# Patient Record
Sex: Female | Born: 1955 | Race: White | Hispanic: No | Marital: Married | Smoking: Never smoker
Health system: Southern US, Community
[De-identification: ages and names within clinical notes are randomized; demographics above are authoritative.]

## PROBLEM LIST (undated history)

## (undated) DIAGNOSIS — K219 Gastro-esophageal reflux disease without esophagitis: Secondary | ICD-10-CM

## (undated) DIAGNOSIS — D509 Iron deficiency anemia, unspecified: Secondary | ICD-10-CM

## (undated) DIAGNOSIS — R1310 Dysphagia, unspecified: Secondary | ICD-10-CM

## (undated) DIAGNOSIS — K222 Esophageal obstruction: Secondary | ICD-10-CM

## (undated) DIAGNOSIS — I10 Essential (primary) hypertension: Secondary | ICD-10-CM

## (undated) DIAGNOSIS — R05 Cough: Secondary | ICD-10-CM

## (undated) DIAGNOSIS — K449 Diaphragmatic hernia without obstruction or gangrene: Secondary | ICD-10-CM

## (undated) DIAGNOSIS — L509 Urticaria, unspecified: Secondary | ICD-10-CM

## (undated) DIAGNOSIS — R7303 Prediabetes: Secondary | ICD-10-CM

## (undated) DIAGNOSIS — Z91014 Allergy to mammalian meats: Secondary | ICD-10-CM

## (undated) HISTORY — DX: Essential (primary) hypertension: I10

## (undated) HISTORY — PX: COLONOSCOPY: SHX174

## (undated) HISTORY — DX: Urticaria, unspecified: L50.9

## (undated) HISTORY — PX: LAPAROSCOPIC VAGINAL HYSTERECTOMY WITH SALPINGO OOPHORECTOMY: SHX6681

## (undated) HISTORY — PX: ESOPHAGOGASTRODUODENOSCOPY (EGD) WITH ESOPHAGEAL DILATION: SHX5812

## (undated) HISTORY — PX: TUBAL LIGATION: SHX77

---

## 1985-05-17 HISTORY — PX: MANDIBLE SURGERY: SHX707

## 2002-05-03 ENCOUNTER — Encounter (INDEPENDENT_AMBULATORY_CARE_PROVIDER_SITE_OTHER): Payer: Self-pay | Admitting: Specialist

## 2002-05-03 ENCOUNTER — Inpatient Hospital Stay (HOSPITAL_COMMUNITY): Admission: RE | Admit: 2002-05-03 | Discharge: 2002-05-04 | Payer: Self-pay | Admitting: Obstetrics and Gynecology

## 2003-03-15 ENCOUNTER — Other Ambulatory Visit: Admission: RE | Admit: 2003-03-15 | Discharge: 2003-03-15 | Payer: Self-pay | Admitting: Obstetrics and Gynecology

## 2003-03-27 ENCOUNTER — Ambulatory Visit (HOSPITAL_COMMUNITY): Admission: RE | Admit: 2003-03-27 | Discharge: 2003-03-27 | Payer: Self-pay | Admitting: Obstetrics and Gynecology

## 2003-10-17 ENCOUNTER — Encounter (HOSPITAL_COMMUNITY): Admission: RE | Admit: 2003-10-17 | Discharge: 2003-10-18 | Payer: Self-pay | Admitting: Family Medicine

## 2003-11-22 ENCOUNTER — Ambulatory Visit (HOSPITAL_COMMUNITY): Admission: RE | Admit: 2003-11-22 | Discharge: 2003-11-22 | Payer: Self-pay | Admitting: Internal Medicine

## 2004-01-22 ENCOUNTER — Ambulatory Visit (HOSPITAL_COMMUNITY): Admission: RE | Admit: 2004-01-22 | Discharge: 2004-01-22 | Payer: Self-pay | Admitting: Family Medicine

## 2004-06-04 ENCOUNTER — Encounter: Admission: RE | Admit: 2004-06-04 | Discharge: 2004-06-04 | Payer: Self-pay | Admitting: Neurology

## 2004-06-04 ENCOUNTER — Encounter: Admission: RE | Admit: 2004-06-04 | Discharge: 2004-06-04 | Payer: Self-pay | Admitting: Family Medicine

## 2004-06-19 ENCOUNTER — Other Ambulatory Visit: Admission: RE | Admit: 2004-06-19 | Discharge: 2004-06-19 | Payer: Self-pay | Admitting: Obstetrics and Gynecology

## 2004-12-07 ENCOUNTER — Ambulatory Visit: Payer: Self-pay | Admitting: Internal Medicine

## 2005-07-20 ENCOUNTER — Other Ambulatory Visit: Admission: RE | Admit: 2005-07-20 | Discharge: 2005-07-20 | Payer: Self-pay | Admitting: Obstetrics and Gynecology

## 2005-11-09 ENCOUNTER — Ambulatory Visit: Payer: Self-pay | Admitting: Internal Medicine

## 2005-11-15 ENCOUNTER — Ambulatory Visit (HOSPITAL_COMMUNITY): Admission: RE | Admit: 2005-11-15 | Discharge: 2005-11-15 | Payer: Self-pay | Admitting: Internal Medicine

## 2005-12-10 ENCOUNTER — Ambulatory Visit (HOSPITAL_COMMUNITY): Admission: RE | Admit: 2005-12-10 | Discharge: 2005-12-10 | Payer: Self-pay | Admitting: Internal Medicine

## 2005-12-10 ENCOUNTER — Ambulatory Visit: Payer: Self-pay | Admitting: Internal Medicine

## 2006-09-01 ENCOUNTER — Encounter: Admission: RE | Admit: 2006-09-01 | Discharge: 2006-09-01 | Payer: Self-pay | Admitting: Obstetrics and Gynecology

## 2007-08-29 ENCOUNTER — Ambulatory Visit (HOSPITAL_COMMUNITY): Admission: RE | Admit: 2007-08-29 | Discharge: 2007-08-29 | Payer: Self-pay | Admitting: Family Medicine

## 2009-06-13 ENCOUNTER — Encounter: Admission: RE | Admit: 2009-06-13 | Discharge: 2009-06-13 | Payer: Self-pay | Admitting: Psychiatry

## 2010-10-02 NOTE — Consult Note (Signed)
NAME:  Kelly, Watkins NO.:  [id number]   MEDICAL RECORD NO.:  [id number]                   PATIENT TYPE:   LOCATION:                                       FACILITY:  [facility]   PHYSICIAN:  Lionel December, M.D.                 DATE OF BIRTH:  05-20-1955   DATE OF CONSULTATION:  10/25/2003  DATE OF DISCHARGE:                                   CONSULTATION   GASTROENTEROLOGY CONSULTATION:   REFERRING PHYSICIAN:  Dr. Phillips Odor   REASON FOR CONSULTATION:  Dysphagia.   Patient is also interested in screening colonoscopy.   HISTORY OF PRESENT ILLNESS:  Kelly Watkins is a 55 year old Caucasian female who was  referred through courtesy of Dr. Phillips Odor for evaluation of solid food  dysphagia which started 18 months ago following hysterectomy.  She recalls  after she went home for a week or so she had painful swallowing.  While pain  has disappeared, she has continued to experience dysphagia intermittently.  She feels as if food is getting stuck at the level of mid chest.  She gets  relief by waiting or sometimes she has to wash food bolus down.  She has not  had any episode of regurgitation.  She denies hoarseness or chronic cough.  She has heartburn but this is infrequent.  She has experienced pain in her  neck and throat which is shooting and once again not very frequent.  She  thought it was from her thyroid.  She had ultrasound of her neck.  She had  tiny nodules in both the right and the left lobe but no abnormality was  noted.  Also had thyroid scan which revealed enlarged glands bilaterally but  no evidence of hot or cold nodules.  She has a good appetite.  She denies  involuntary weight loss.  Her bowels move regularly and there is no history  of melena or rectal bleeding.  Patient is interested in having screening  colonoscopy.  She will be 48 in the next few months.   She is presently on Vivelle patch 0.1 mg to skin twice a week.   PAST MEDICAL HISTORY:  She was  diagnosed to have goiter about 15 years ago.  It has not caused any problems.  She had jaw reconstruction to correct her  bite in 1989.  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  About 5 years she had tubal ligation and in December 2003 she had BSO  with hysterectomy for pain and endometriosis.   ALLERGIES:  Allergies to UNKNOWN MEDICATION which caused a rash.   FAMILY HISTORY:  Mother has CAD; father and two sisters are in good health;  one maternal aunt died of breast carcinoma in her 26s.   SOCIAL HISTORY:  She is married; she has one child.  She is presently  working at [employer]; prior  to that she worked at [employer].  She has never smoked cigarettes and does not drink alcohol.   PHYSICAL EXAMINATION:  GENERAL:  Pleasant well-developed, well-nourished  Caucasian female who is in no acute distress.  VITAL SIGNS:  She weighs 173-1/2 pounds, she is 5 feet 4 inches tall, pulse  66 per minute, blood pressure 120/80, temperature is 97.5.  HEENT:  Conjunctivae are pink; sclerae are nonicteric.  Oropharyngeal mucosa  is normal.  Dentition in satisfactory condition.  Neck without adenopathy.  Thyroid is felt to be upper limit of normal.  No definite nodule noted on  palpation.  CARDIAC:  Regular rate, normal S1 and S3.  No murmur or gallop noted.  RESPIRATORY:  Lungs are clear to auscultation.  GI:  Abdomen is symmetrical and soft without organomegaly, masses or  tenderness.  RECTAL:  Exam is deferred.  EXTREMITIES:  No clubbing or edema noted.   ASSESSMENT:  Kelly Watkins is a 55 year old Caucasian female with intermittent solid  food dysphagia.  This symptom started 18 months ago following her  hysterectomy.  It is possible that she had esophageal injury to a pill  however, this may just be a coincidence.  She does not have symptoms of  gastroesophageal reflux disease therefore, she may have distal esophageal  ring.   Patient is also interested  in screening colonoscopy.  She is average risk  for this procedure.  I feel it would be more cost effective for her to have  a colonoscopy following an EGD so that she would not have a second trip.   RECOMMENDATIONS:  Diagnostic EGD and possible ED along with a screening  colonoscopy to be performed at [facility] within the next 4 weeks.   I have reviewed the procedure risks with the patient and she is agreeable.   I would like to thank Dr. Phillips Odor for the opportunity to participate in the  care of this nice lady.      ___________________________________________                                            Lionel December, M.D.   NR/MEDQ  D:  10/25/2003  T:  10/26/2003  Job:  [id number]   cc:   Corrie Mckusick, M.D.  [address] [place]. [place]  [place] [address]  Fax: [phone]   [facility]

## 2010-10-02 NOTE — Op Note (Signed)
NAME:  Kelly Watkins, Kelly Watkins                           ACCOUNT NO.:  [id number]   MEDICAL RECORD NO.:  [id number]                   PATIENT TYPE:  AMB   LOCATION:  DAY                                  FACILITY:  [facility]   PHYSICIAN:  Lionel December, M.D.                 DATE OF BIRTH:  1955-07-03   DATE OF PROCEDURE:  11/22/2003  DATE OF DISCHARGE:                                 OPERATIVE REPORT   PROCEDURE:  Esophagogastroduodenoscopy with esophageal dilatation followed  by total colonoscopy.   INDICATIONS:  Kelly Watkins is a 55 year old Caucasian female who has an 32-month  history of intermittent solid food dysphagia.  She does not have heartburn.  She will be 55 years old in a few months.  She, therefore, also is  interested in undergoing screening colonoscopy.  Both procedures were  reviewed with the patient and informed consent was obtained.   PREMEDICATION:  Cetacaine spray for pharyngeal topical anesthesia, Demerol  50 mg IV, Versed 17 mg IV.   FINDINGS:  The procedures were performed in the endoscopy suite.  The  patient's vital signs and O2 saturation were monitored during the procedure  and remained stable.   ESOPHAGOGASTRODUODENOSCOPY:  The patient was placed in the left lateral  recumbent position and Olympus videoscope was passed through the oropharynx  without any difficulty into the esophagus.   Esophagus:  Mucosa of the esophagus was normal.  There was incomplete  noncritical ring, distal esophagus.  There was circumferential Schatzki's  ring which was quite thick.  The scope, however, was easily passed across  it.  There was a 3 cm size sliding hiatal hernia.   Stomach:  It was empty and distended very well on insufflation.  Folds of  the proximal stomach were normal.  Multiple small polyps were noted in the  gastric body and fundus.  Most of these are a few millimeters.  The largest  one was 6-7 mm.  Five of these were biopsied for histology endoscopically.  These appeared to  be hypoplastic.  There was antral erythema and nodularity.  Pyloric channel was patent.  Angularis, fundus and cardia were examined by  retroflexing the scope.  Pyloric channel was normal.  Angularis and cardia  were normal.   Duodenum:  Examination of the bulb revealed patchy erythema and edema, but  no ulcer crater was noted.  The mucosa and folds of the second part of the  duodenum were normal.   The endoscope was withdrawn.   The esophagus was dilated by passing 54-French Maloney dilator to full  insertion.  A 56-French Maloney dilator could not be passed completely.  The  esophagus was reexamined and Schatzki's ring was noted to have been  disrupted.  Endoscope was withdrawn and the patient prepared for the  procedure.   TOTAL COLONOSCOPY:  Rectal examination performed.  No abnormality noted on  the external or digital exam.  Olympus videoscope was placed in the rectum  and advanced under vision to the sigmoid colon and beyond.  Preparation was  satisfactory.  Scope was passed to the cecum which was identified by the  ileocecal valve and appendiceal orifice.  Pictures were taken for the  record.  As the scope was withdrawn, colonic mucosa was once again carefully  examined.  No mucosal abnormalities were noted.  Rectal mucosa similarly was  normal.  Scope was retroflexed to examine anorectal junction and small  hemorrhoids were noted above the dentate line.  Endoscope was straightened  and withdrawn. The patient tolerated the procedure well.   FINAL DIAGNOSIS:  1. 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  2. Small sliding hiatal hernia.  3. 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  Biopsy     taken for histology.  4. Gastroduodenitis.  5. Normal colonoscopy except small internal hemorrhoids.   RECOMMENDATIONS:  1. Antireflux measures.  2. H pylori serology will be checked.  3. I will be contacting the  patient with results of blood tests and biopsy     and further recommendations.      ___________________________________________                                            Lionel December, M.D.   NR/MEDQ  D:  11/22/2003  T:  11/23/2003  Job:  [id number]   cc:   Corrie Mckusick, M.D.  [address] [place]. [place]  [place]  [place] [address]  Fax: [phone]

## 2010-10-02 NOTE — Op Note (Signed)
NAME:  Kelly Watkins, Kelly Watkins                           ACCOUNT NO.:  [id number]   MEDICAL RECORD NO.:  [id number]                   PATIENT TYPE:  INP   LOCATION:  9399                                 FACILITY:  [facility]   PHYSICIAN:  Guy Sandifer. Arleta Creek, M.D.           DATE OF BIRTH:  04-25-1956   DATE OF PROCEDURE:  05/03/2002  DATE OF DISCHARGE:                                 OPERATIVE REPORT   PREOPERATIVE DIAGNOSES:  1. Menorrhagia.  2. Pelvic pain.  3. Pelvic relaxation.   POSTOPERATIVE DIAGNOSES:  1. Menorrhagia.  2. Endometriosis.  3. Pelvic relaxation.  4. Pelvic adhesions.   PROCEDURE:  1. Laparoscopically assisted vaginal hysterectomy with bilateral salpingo-     oophorectomy.  2. Anterior vaginal repair.  3. Lysis of adhesions.  4. Ablation of endometriosis.   SURGEON:  Guy Sandifer. Henderson Cloud, M.D.   ASSISTANTStann Mainland. Vincente Poli, M.D.   ANESTHESIA:  General with endotracheal intubation.   ESTIMATED BLOOD LOSS:  200 cc.   SPECIMENS:  Uterus, tubes, and ovaries weighing 175 g.   INDICATIONS AND CONSENT:  The patient is a 55 year old married white female  G1, P1 status post tubal ligation who complains of the cervix protruding  from the vagina and pelvic pain and heavy vaginal bleeding.  Details are  dictated in the history and physical.  Laparoscopically assisted vaginal  hysterectomy with bilateral salpingo-oophorectomy, anterior/posterior  vaginal repair is discussed with the patient.  Potential risks and  complications have been discussed preoperatively including, but not limited  to, infection, bowel, bladder, ureteral damage, bleeding requiring  transfusion of blood products with possible transfusion reaction, HIV and  hepatitis acquisition, DVT, PE, pneumonia, fistula formation, postoperative  dyspareunia, and recurrent pelvic relaxation.  Issues of menopausal changes  and symptoms as well as indications and potential complications of estrogen  therapy and  alternative therapies have also been discussed.  All questions  have been answered and consent is signed and on the chart.   FINDINGS:  Upper abdomen is grossly normal.  There are multiple filmy  adhesions at the pelvic brim bilaterally.  Uterus is upper limits normal  size.  There is a 6 mm implant of endometriosis over the bladder dome.  The  ovaries are normal bilaterally.  Tubes are status post ligation.  Pelvic  side walls, posterior cul-de-sac is normal.   PROCEDURE:  The patient was taken to the operating room, placed in the  dorsal supine position where general anesthesia is induced via endotracheal  intubation.  She is then placed in the dorsal lithotomy position where she  is prepped abdominally and vaginally.  Bladder was straight catheterized and  Hulka tenaculum was placed in the uterus as a manipulator.  She is draped in  a sterile fashion.  A small infraumbilical incision was made and a 10-11  disposable trocar sleeve was placed without difficulty.  Placement was  verified with the  laparoscope and no damage to surrounding structures is  noted.  Pneumoperitoneum is induced.  A small suprapubic incision is made  and the 5 mm disposable trocar sleeve is placed under direct visualization  without difficulty.  The above findings are noted.  The course of the  ureters is noted bilaterally and seem to be well clear of the area of  surgery.  The implant of endometriosis on the bladder dome is cauterized  with bipolar cautery.  Then, using the gyrus cautery cutting instrument, the  infundibulopelvic ligaments followed by the broad ligament, round ligament  is taken down bilaterally down to the level of the vesicouterine fold  bilaterally.  Good hemostasis is maintained.  The vesicouterine fold is then  incised in the midline and dissected bilaterally.  The adhesions to the  pelvic brim bilaterally are taken down sharply.  Brief bipolar cautery at  the point of insertion on the  anterior abdominal wall is used to maintain  hemostasis.  This is well clear of the bowel on both sides.  Then attention  is turned to the vagina.  Posterior cul-de-sac is entered sharply.  Cervix  is circumscribed with the scalpel and the vaginal mucosa is advanced bluntly  and sharply.  The uterosacral ligaments are taken down bilaterally with the  gyrus cautery.  This is followed by the bladder pillars, cardinal ligaments,  uterine vessels bilaterally.  The fundus with tubes and ovaries is then  delivered posteriorly and remaining ligaments are taken down.  Specimen is  delivered.  The uterosacral ligaments are then plicated to the vaginal cuff  bilaterally with 0 Monocryl suture.  All suture will be 0 Monocryl unless  otherwise designated.  Uterosacral ligaments are then plicated in the  midline with a single suture.  Posterior vaginal cuff was closed with figure-  of-eight.  Then, careful inspection of the vagina reveals just a small  cystocele.  The vaginal introitus is fairly narrow and there is excellent  support of the posterior perineal body.  An anterior repair is then done,  dissecting the vaginal mucosa in the midline down to a point approximately 2  cm below the urethral meatus.  This dissection is then carried out  bilaterally sharply and bluntly.  The urethrovesical angle is then supported  with two interrupted sutures.  The vesicovaginal fascia is reapproximated in  midline with interrupted sutures.  A small amount of excess vaginal mucosa  is removed.  The anterior half of the vaginal cuff is closed with  interrupted 0 Monocryl suture.  The anterior vaginal mucosa is closed with  running locking 2-0 Monocryl suture.  Again, careful reinspection reveals  essentially no rectocele and again excellent support of the posterior  perineal body.  Therefore, a posterior repair is not done.  Bladder is then  drained with Foley catheter and clear urine is noted.  Bladder is  then filled retrograde using approximately 300 cc of normal saline.  A small stab  incision is made suprapubically and a Bonanno catheter is placed  suprapubically on the first attempt without difficulty.  There is good  aspiration of saline with and without the Stylette in place.  Bladder is  drained while the suprapubic catheter is stitched into place with nylon  suture.  Foley catheter is removed.  A 2 inch vaginal pack with Estrace  cream is then placed in the vagina.  Attention is then turned to the  abdomen.  Pneumoperitoneum is reintroduced and good hemostasis is obtained  with  bipolar cautery at the vaginal cuff.  This is inspected multiple times  under reduced pneumoperitoneum and seemed to be hemostatic.  Excess fluid is  removed.  Suprapubic trocar sleeve is removed.  Pneumoperitoneum is reduced  and again good hemostasis is noted.  The umbilical trocar sleeve is removed  as well.  A 0 Vicryl suture was used to reapproximate the subcutaneous  tissues in the umbilical incision with care being taken not to pick up any  underlying structures.  The skin incisions are then injected with 0.5% plain  Marcaine and the skin is closed with Dermabond on both incisions.  The  patient did receive preoperative antibiotics.  All counts correct.  The  patient was awakened and taken to recovery room in stable condition.                                               Guy Sandifer Arleta Creek, M.D.    JET/MEDQ  D:  05/03/2002  T:  05/03/2002  Job:  [id number]

## 2010-10-02 NOTE — Discharge Summary (Signed)
   NAME:  Kelly Watkins, Kelly Watkins                           ACCOUNT NO.:  [id number]   MEDICAL RECORD NO.:  [id number]                   PATIENT TYPE:  INP   LOCATION:  9313                                 FACILITY:  [facility]   PHYSICIAN:  Guy Sandifer. Arleta Creek, M.D.           DATE OF BIRTH:  08/25/1955   DATE OF ADMISSION:  05/03/2002  DATE OF DISCHARGE:  05/04/2002                                 DISCHARGE SUMMARY   ADMISSION DIAGNOSES:  1. Menorrhagia.  2. Pelvic pain.  3. Pelvic relaxation.   DISCHARGE DIAGNOSES:  1. Menorrhagia.  2. Endometriosis.  3. Pelvic relaxation.  4. Pelvic adhesions.   DESCRIPTION OF PROCEDURE:  On May 03, 2002, laparoscopically assisted  vaginal hysterectomy with bilateral salpingo-oophorectomy, lysis of  adhesions, ablation of endometriosis and anterior vaginal repair.   HOSPITAL COURSE:  The patient was admitted to the hospital to undergo the  above procedure without complications.  Estimated blood loss was 200 cc . On  the evening of surgery, she has good pain relief and is without nausea or  vomiting.  Urine output is clear, and she is afebrile.  On the morning of  the first postoperative day she is passing flatus, has good pain relief.  She remained afebrile.  White count is 8.3, hemoglobin 9.3, platelets  274,000.  On the evening of discharge she is viding well with low residual  urine's.  She remains afebrile and is ambulating, tolerating a regular diet.   DISCHARGE CONDITION:  Good.   </DIET >/  Diet good as tolerating.   ACTIVITY:  1. No lifting or operation of automobiles.  2. No vaginal entry.   PLAN:  1. She is to call the office for problems including but not limited to     temperature of 101.0 or greater, heavy vaginal bleeding, persistent     nausea, vomiting or increasing pain.  Follow up is in the office in two     weeks.   MEDICATIONS:  1. Percocet 5/325 mg #30, one to two p.o. q.6-8h. p.r.n.  2.     Ibuprofen 600 mg pother.  q.6h p.r.n.  3. Multivitamin daily.  4. Colace daily for two to three weeks p.r.n.                                               Guy Sandifer Arleta Creek, M.D.    JET/MEDQ  D:  05/04/2002  T:  05/06/2002  Job:  [id number]

## 2010-10-02 NOTE — Op Note (Signed)
NAME:  Kelly Watkins, Kelly Watkins                 ACCOUNT NO.:  [id number]   MEDICAL RECORD NO.:  [id number]          PATIENT TYPE:  AMB   LOCATION:  DAY                           FACILITY:  [facility]   PHYSICIAN:  Lionel December, M.D.    DATE OF BIRTH:  04-07-1956   DATE OF PROCEDURE:  12/10/2005  DATE OF DISCHARGE:                                 OPERATIVE REPORT   PROCEDURE:  Esophagogastroduodenoscopy with esophageal dilation.   INDICATIONS:  Kelly Watkins is a 55 year old Caucasian female with intermittent solid  food dysphagia.  She has history of distal esophageal ring, and her last  EGD/ED was in July 2005.  She had barium pill study which showed narrowing  to distal esophagus and was a moderate size sliding hiatal hernia.  Barium  pill did passed through this area without any difficulty.  She is undergoing  the therapeutic EGD.  Procedure and risks were reviewed with the patient,  and informed consent was obtained.  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   MEDICATIONS FOR CAUGHT CONSCIOUS SEDATION:  Benzocaine spray for pharyngeal  topical anesthesia, Demerol 50 mg IV, Versed 8 mg IV.   FINDINGS:  Procedure performed in endoscopy suite.  The patient's vital  signs and O2 sat were monitored during procedure and remained stable.  The  patient was placed in left lateral position, and Olympus videoscope was  passed via oropharynx without any difficulty into esophagus.   Esophagus.  Mucosa of the esophagus was normal.  There were 2 incomplete  rings at distal esophagus proximal to GE junction.  Mucosa, however, was  normal.  The GE junction was at 35 cm and hiatus was at 38 cm.   Stomach.  It was empty and distended very well with insufflation.  Folds of  proximal stomach were normal.  Examination mucosa revealed multiple gastric  polyps at body and fundus.  Most of these were a few millimeters.  The  largest ones were about 7-8 mm.  These were left  alone as there were  biopsied on her last exam 2 years ago.  Antral mucosa was normal.  Pyloric  channel was patent.  Angularis was normal.  Hernia was easily seen on  retroflexed view.   Duodenum.  Bulbar mucosa was normal.  Scope was passed into second part of  duodenum where mucosa and folds were normal.  Endoscope was withdrawn.   Esophagus was dilated by passing 56-French Maloney dilator to full  insertion.  As the dilator was withdrawn, endoscope was passed again, and  there was no mucosal disruption noted.  Therefore, balloon dilator was  passed to the scope and guide was pushed in the gastric lumen.  Initially,  it was insufflated to a diameter of 19 mm, but there was no mucosal  disruption and subsequently was inflated to a diameter of 20 mm and  maintained for about a minute.  I was able to gradually advance the balloon  into gastric lumen.  There were 2 linear tears which were rather small.  Proximal one  was prominent than the distal one.  Pictures taken for the  record.  Endoscope was withdrawn.  The patient tolerated the procedure well.   FINAL DIAGNOSES:  1.  Two rings at distal esophagus proximal to GE junction which could not be      disrupted by passing 56-French Maloney dilator, but 2 distal esophageal      rings proximal to the GE junction were disrupted with 19-20 mm balloon      but could not be disrupted by passing 56-French Maloney dilator.  2.  Small sliding hiatal hernia.  3.  Multiple small gastric polyps at body and fundus (hyperplastic).  4.  Normal examination of the first and second part of the duodenum.   __________ __________ __________   PROCEDURE:  Esophagogastroduodenoscopy with esophageal dilation.   INDICATIONS:  Kelly Watkins is a 55 year old Caucasian female with intermittent solid  food dysphagia.  She had her esophagus dilated 2 years ago.  She was found  to have a ring and a hernia.  At that time, she also had gastric polyps  which were biopsied and  returned to be hyperplastic.  She had barium pill  study recently which showed mild distal esophageal narrowing and a moderate  size sliding hiatal hernia.  A 13-mm barium pill passed through this area  without any difficulty.   She is undergoing therapeutic procedure.   Procedure and risks were reviewed with the patient, and informed consent was  obtained.   MEDICATIONS FOR CONSCIOUS SEDATION:  Benzocaine spray for pharyngeal topical  anesthesia, Demerol 50 mg IV, Versed 8 mg IV.   FINDINGS:  Procedure performed in endoscopy suite.  The patient's vital  signs and O2 saturation were monitored during the procedure and remained  stable.  The patient was placed in left lateral position.  Olympus  videoscope was passed via oropharynx without any difficulty into esophagus.   Esophagus.  Mucosa of the proximal and middle segment was normal.  Distally,  there were 2 rings, but they were incomplete.  Lumen was somewhat tortuous.  GE junction was unremarkable located at 35 cm from the incisors and hiatus  was at 38.   Stomach.  It distended very well with insufflation.  Folds of proximal  stomach were normal.  Examination of mucosa revealed multiple polyps at  gastric body and fundus.  Most of these are 4 to 5 mm, but the largest were  7-8 mm.  These were left alone as they have been biopsied before.  There  were a few telangiectasia at antrum which were left alone.  Pyloric channel  was patent.  Angularis was unremarkable, and hernia was easily seen on  retroflexed view.   Duodenum.  Bulbar mucosa was normal.  Scope was passed into the second part  of duodenum where mucosa and folds were normal.   Endoscope was withdrawn.  Esophagus was dilated by passing 56-French [facility]  dilator.  As the dilator was withdrawn, endoscope was passed again, and  rings were still intact.  Therefore, 18- to 20-mm balloon was used.  Balloon dilator was advanced through the scope.  Guidewire was pushed into  gastric  lumen.  Balloon catheter was positioned across the distal esophagus and  insufflated to a diameter of 19 mm, but the ring was still intact and was  subsequently disrupted with a diameter of 20 mm.  It was maintained for  about a minute.  Pictures taken for the record.  Balloon catheter was  withdrawn.  Stomach was deflated,  and the endoscope was withdrawn.  The  patient tolerated the procedure well.   FINAL DIAGNOSES:  1.  Two distal esophageal rings which were disrupted with 19- to 20-mm      balloon.  The passage of 56-French [facility] dilator did not induce      mucosal disruption.  2.  Small sliding hiatal hernia.  3.  Multiple hyperplastic gastric polyps which were less than 8 mm in      diameter.  4.  Few tiny antral vascular ectasia.   RECOMMENDATIONS:  1.  She will continue anti-reflux measures and PPI as before.  2.  The patient will call office with a progress report in one week from      now.      Lionel December, M.D.  Electronically Signed     NR/MEDQ  D:  12/10/2005  T:  12/10/2005  Job:  [id number]   cc:   Corrie Mckusick, M.D.  Fax: [phone]

## 2010-10-02 NOTE — H&P (Signed)
NAME:  Kelly Watkins, Kelly Watkins                             ACCOUNT NO.:  [id number]   MEDICAL RECORD NO.:  [id number]                   PATIENT TYPE:   LOCATION:                                       FACILITY:  [facility]   PHYSICIAN:  Guy Sandifer. Arleta Creek, M.D.           DATE OF BIRTH:   DATE OF ADMISSION:  05/03/2002  DATE OF DISCHARGE:                                HISTORY & PHYSICAL   CHIEF COMPLAINT:  Heavy painful menses and cervix is falling out.   HISTORY OF PRESENT ILLNESS:  This patient is a 55 year old married white  female, Gravida I, Para I, status post tubal ligation, who is having fairly  regular menses. However, they are becoming progressively heavier. She has at  least one day of changing the pad every two hours with heavy clotting. She  has pain that essentially lasts through the month over her lower back.  However, before her menses, the pain in the lower back as well as the  anterior abdominal wall becomes quite bad. She has bumper dyspareunia and  nocturia about four times a night. An ultrasound on March 02, 2002  describes a uterus measuring 8.9 x 4.6 x 5.7 mm with normal appearing  ovaries. After careful discussion of the options, the patient is being  admitted for laparoscopically assisted vaginal hysterectomy with bilateral  salpingo-oophorectomy and anterior/posterior vaginal repair.   PAST MEDICAL HISTORY:  1. Occasional diarrhea.  2. Superficial varicose veins in the lower extremities.   PAST SURGICAL HISTORY:  1. Tubal ligation.  2. Eye surgery eight years ago.  3. Jaw surgery fifteen years ago.   MEDICATIONS:  None.   ALLERGIES:  HCTZ (itching), PENICILLIN (flu-like symptoms).   FAMILY HISTORY:  Multiple gestations in grandmother. Diabetes mellitus in  aunt. Leukemia and breast cancer in an aunt and skin cancer in an uncle.  Heart disease in mother.   OBSTETRIC HISTORY:  Vaginal delivery times one.   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 She  denies tobacco, alcohol, or drug abuse.   REVIEW OF SYSTEMS:  Negative except as above.   PHYSICAL EXAMINATION:  VITAL SIGNS: Height 5'4. Weight 156 pounds. Blood  pressure 120/74.  HEENT: Without thyromegaly.  LUNGS:  Clear to auscultation and percussion.  HEART: Regular rate and rhythm.  BACK: No cardiovascular tenderness.  BREAST: Without masses.  ABDOMEN: Soft and nontender without masses.  PELVIC: Vaginal and cervix without lesion. Uterus normal size and mobile  prolapsing down close to the vaginal introitus. There is a first degree  cystocele and first degree rectocele. Adnexa non-tender without masses.  RECTAL: Examination without masses.  EXTREMITIES: Within normal limits.  NEURO: Grossly within normal limits.   ASSESSMENT:  1. Menorrhagia.  2. Pelvic pain.  3. Pelvic relaxation.   PLAN:  Laparoscopically assisted vaginal hysterectomy with bilateral  salpingo-oophorectomy and anterior posterior vaginal repairs.  Guy Sandifer Arleta Creek, M.D.    JET/MEDQ  D:  04/30/2002  T:  04/30/2002  Job:  [id number]

## 2011-03-01 ENCOUNTER — Other Ambulatory Visit (HOSPITAL_COMMUNITY): Payer: Self-pay | Admitting: Urology

## 2011-03-01 DIAGNOSIS — R350 Frequency of micturition: Secondary | ICD-10-CM

## 2011-03-02 ENCOUNTER — Ambulatory Visit (HOSPITAL_COMMUNITY)
Admission: RE | Admit: 2011-03-02 | Discharge: 2011-03-02 | Disposition: A | Discharge status: Home / Self Care | Payer: BC Managed Care – PPO | Source: Ambulatory Visit | Attending: Urology | Admitting: Urology

## 2011-03-02 DIAGNOSIS — R350 Frequency of micturition: Secondary | ICD-10-CM | POA: Insufficient documentation

## 2011-03-03 ENCOUNTER — Other Ambulatory Visit (HOSPITAL_COMMUNITY): Payer: Self-pay

## 2011-03-04 ENCOUNTER — Ambulatory Visit (HOSPITAL_COMMUNITY): Payer: BC Managed Care – PPO

## 2011-09-03 ENCOUNTER — Other Ambulatory Visit: Payer: Self-pay | Admitting: Obstetrics and Gynecology

## 2011-11-10 ENCOUNTER — Encounter (HOSPITAL_COMMUNITY): Payer: Self-pay | Admitting: Emergency Medicine

## 2011-11-10 ENCOUNTER — Emergency Department (HOSPITAL_COMMUNITY)
Admission: EM | Admit: 2011-11-10 | Discharge: 2011-11-10 | Disposition: A | Discharge status: Home / Self Care | Payer: BC Managed Care – PPO | Attending: Emergency Medicine | Admitting: Emergency Medicine

## 2011-11-10 DIAGNOSIS — I10 Essential (primary) hypertension: Secondary | ICD-10-CM | POA: Insufficient documentation

## 2011-11-10 DIAGNOSIS — K219 Gastro-esophageal reflux disease without esophagitis: Secondary | ICD-10-CM | POA: Insufficient documentation

## 2011-11-10 DIAGNOSIS — R42 Dizziness and giddiness: Secondary | ICD-10-CM | POA: Insufficient documentation

## 2011-11-10 HISTORY — DX: Gastro-esophageal reflux disease without esophagitis: K21.9

## 2011-11-10 LAB — URINALYSIS, ROUTINE W REFLEX MICROSCOPIC
Bilirubin Urine: NEGATIVE
Glucose, UA: NEGATIVE mg/dL
Hgb urine dipstick: NEGATIVE
Ketones, ur: NEGATIVE mg/dL
Leukocytes, UA: NEGATIVE
Nitrite: NEGATIVE
Protein, ur: NEGATIVE mg/dL
Specific Gravity, Urine: <1.005 — ABNORMAL LOW (ref 1.005–1.030)
Urobilinogen, UA: 0.2 mg/dL (ref 0.0–1.0)
pH: 6 (ref 5.0–8.0)

## 2011-11-10 LAB — COMPREHENSIVE METABOLIC PANEL
ALT: 12 U/L (ref 0–35)
AST: 16 U/L (ref 0–37)
Albumin: 3.8 g/dL (ref 3.5–5.2)
Alkaline Phosphatase: 79 U/L (ref 39–117)
BUN: 17 mg/dL (ref 6–23)
CO2: 27 mEq/L (ref 19–32)
Calcium: 9.4 mg/dL (ref 8.4–10.5)
Chloride: 103 mEq/L (ref 96–112)
Creatinine, Ser: 0.8 mg/dL (ref 0.50–1.10)
GFR calc Af Amer: >90 mL/min (ref >90)
GFR calc non Af Amer: 80 mL/min — ABNORMAL LOW (ref >90)
Glucose, Bld: 105 mg/dL — ABNORMAL HIGH (ref 70–99)
Potassium: 3.8 mEq/L (ref 3.5–5.1)
Sodium: 141 mEq/L (ref 135–145)
Total Bilirubin: 0.3 mg/dL (ref 0.3–1.2)
Total Protein: 7.4 g/dL (ref 6.0–8.3)

## 2011-11-10 LAB — CBC
HCT: 40.2 % (ref 36.0–46.0)
Hemoglobin: 13.3 g/dL (ref 12.0–15.0)
MCH: 27.1 pg (ref 26.0–34.0)
MCHC: 33.1 g/dL (ref 30.0–36.0)
MCV: 82 fL (ref 78.0–100.0)
Platelets: 347 10*3/uL (ref 150–400)
RBC: 4.9 MIL/uL (ref 3.87–5.11)
RDW: 12.5 % (ref 11.5–15.5)
WBC: 7.5 10*3/uL (ref 4.0–10.5)

## 2011-11-10 LAB — TROPONIN I: Troponin I: <0.3 ng/mL (ref <0.30)

## 2011-11-10 MED ORDER — METOPROLOL TARTRATE 50 MG PO TABS: 50.0000 mg | ORAL_TABLET | Freq: Two times a day (BID) | ORAL | Status: DC

## 2011-11-10 NOTE — ED Provider Notes (Signed)
I have personally seen and examined the patient.  I have discussed the plan of care with the resident.  I have reviewed the documentation on PMH/FH/Soc. History.  I have reviewed the documentation of the resident and agree.  I have reviewed and agree with the ECG interpretation(s) documented by the resident.  Pt well appearing, denies focal weakness, denies significant headache, denies CP/SOB Needs outpatient management of HTN   Joya Gaskins, MD 11/10/11 1446

## 2011-11-10 NOTE — ED Notes (Signed)
Pt at work. Began to have dizziness with headache and some jaw pain. Pt took BP and was elevated 190/108. Called EMS for evaluation. Pt sent here for continued follow-up. Denies chest pain or SOB.

## 2011-11-10 NOTE — Discharge Instructions (Signed)
Your high blood pressure is most likely chronic and worsened by stress. You should start low dose medication. Make an appointment with Dr. Phillips Odor in next 1-2 weeks for follow up. If you have any chest pain, shortness of breath or other concerning symptoms then return to the ED.  Hypertension As your heart beats, it forces blood through your arteries. This force is your blood pressure. If the pressure is too high, it is called hypertension (HTN) or high blood pressure. HTN is dangerous because you may have it and not know it. High blood pressure may mean that your heart has to work harder to pump blood. Your arteries may be narrow or stiff. The extra work puts you at risk for heart disease, stroke, and other problems.  Blood pressure consists of two numbers, a higher number over a lower, 110/72, for example. It is stated as "110 over 72." The ideal is below 120 for the top number (systolic) and under 80 for the bottom (diastolic). Write down your blood pressure today. You should pay close attention to your blood pressure if you have certain conditions such as:  Heart failure.   Prior heart attack.   Diabetes   Chronic kidney disease.   Prior stroke.   Multiple risk factors for heart disease.  To see if you have HTN, your blood pressure should be measured while you are seated with your arm held at the level of the heart. It should be measured at least twice. A one-time elevated blood pressure reading (especially in the Emergency Department) does not mean that you need treatment. There may be conditions in which the blood pressure is different between your right and left arms. It is important to see your caregiver soon for a recheck. Most people have essential hypertension which means that there is not a specific cause. This type of high blood pressure may be lowered by changing lifestyle factors such as:  Stress.   Smoking.   Lack of exercise.   Excessive weight.   Drug/tobacco/alcohol  use.   Eating less salt.  Most people do not have symptoms from high blood pressure until it has caused damage to the body. Effective treatment can often prevent, delay or reduce that damage. TREATMENT  When a cause has been identified, treatment for high blood pressure is directed at the cause. There are a large number of medications to treat HTN. These fall into several categories, and your caregiver will help you select the medicines that are best for you. Medications may have side effects. You should review side effects with your caregiver. If your blood pressure stays high after you have made lifestyle changes or started on medicines,   Your medication(s) may need to be changed.   Other problems may need to be addressed.   Be certain you understand your prescriptions, and know how and when to take your medicine.   Be sure to follow up with your caregiver within the time frame advised (usually within two weeks) to have your blood pressure rechecked and to review your medications.   If you are taking more than one medicine to lower your blood pressure, make sure you know how and at what times they should be taken. Taking two medicines at the same time can result in blood pressure that is too low.  SEEK IMMEDIATE MEDICAL CARE IF:  You develop a severe headache, blurred or changing vision, or confusion.   You have unusual weakness or numbness, or a faint feeling.   You  have severe chest or abdominal pain, vomiting, or breathing problems.  MAKE SURE YOU:   Understand these instructions.   Will watch your condition.   Will get help right away if you are not doing well or get worse.  Document Released: 05/03/2005 Document Revised: 04/22/2011 Document Reviewed: 12/22/2007 [facility] Patient Information 2012 [place], [place].

## 2011-11-10 NOTE — ED Provider Notes (Signed)
History     CSN: [id number]  Arrival date & time 11/10/11  1036   First MD Initiated Contact with Patient 11/10/11 1053      Chief Complaint  Patient presents with  . Hypertension  . Dizziness    (Consider location/radiation/quality/duration/timing/severity/associated sxs/prior treatment) HPI Comments: 56 yo female with history of GERD and goiter present via EMS from work due to an episode of lightheadedness, after which her coworker checked BP and was 190/108. The symptoms lasted several minutes. She also felt some left sided head/jaw pain that lasted several seconds. Currently is symptom free. She had a similar episode about one week ago after which BP was monitored at work (at [employer]) and returned to near normal. States she has been under a lot of stress working at [employer], starting new EMR, and worry about her husband's heart condition.   She denies n/v/d, fever, chest pain, palpitations, dyspnea, visual changes, diaphoresis, leg edema, vertigo, new medications, anxiety, alcohol use, tobacco/drug use, sleep apnea symptoms.  Patient is a 56 y.o. female presenting with hypertension.  Hypertension Pertinent negatives include no abdominal pain, chest pain, chills, fever, nausea, neck pain or rash.    Past Medical History  Diagnosis Date  . GERD (gastroesophageal reflux disease)     Past Surgical History  Procedure Date  . Abdominal hysterectomy   . Tubal ligation   . Eye surgery     Family History  Problem Relation Age of Onset  . Hypertension Mother     History  Substance Use Topics  . Smoking status: Never Smoker   . Smokeless tobacco: Not on file  . Alcohol Use: No    OB History    Grav Para Term Preterm Abortions TAB SAB Ect Mult Living                  Review of Systems  Constitutional: Negative for fever, chills and unexpected weight change.  HENT: Negative for neck pain.   Eyes: Negative for pain.  Cardiovascular: Negative for chest  pain, palpitations and leg swelling.  Gastrointestinal: Negative for nausea, abdominal pain and diarrhea.  Genitourinary: Negative for dysuria and difficulty urinating.  Skin: Negative for rash.  Neurological: Positive for light-headedness. Negative for syncope and speech difficulty.  Psychiatric/Behavioral: Negative for agitation.  All other systems reviewed and are negative.    Allergies  Hydrochlorothiazide  Home Medications   Current Outpatient Rx  Name Route Sig Dispense Refill  . GREEN COFFEE BEAN PO Oral Take 1 tablet by mouth 2 (two) times daily.    Marland Kitchen OMEPRAZOLE 20 MG PO CPDR Oral Take 20 mg by mouth daily.      BP 165/90  Pulse 75  Temp 98.1 F (36.7 C) (Oral)  Resp 18  Ht 5\' 4"  (1.626 m)  Wt 175 lb (79.379 kg)  BMI 30.04 kg/m2  SpO2 100%  Physical Exam  Vitals reviewed. Constitutional: She is oriented to person, place, and time. She appears well-developed and well-nourished. No distress.  HENT:  Head: Normocephalic and atraumatic.  Mouth/Throat: Oropharynx is clear and moist.  Eyes: EOM are normal. Pupils are equal, round, and reactive to light.  Neck: Neck supple. No thyromegaly present.  Cardiovascular: Normal rate, regular rhythm and normal heart sounds.   No murmur heard. Pulmonary/Chest: Effort normal and breath sounds normal. No respiratory distress. She has no wheezes. She has no rales.  Abdominal: Soft. Bowel sounds are normal. She exhibits no distension. There is no tenderness.  Musculoskeletal: She exhibits no edema and no tenderness.  Neurological: She is alert and oriented to person, place, and time. No cranial nerve deficit. She exhibits normal muscle tone. Coordination normal.  Skin: No rash noted. She is not diaphoretic.  Psychiatric: She has a normal mood and affect.    ED Course  Procedures (including critical care time)  Date: 11/10/2011  Rate: 72  Rhythm: normal sinus rhythm  QRS Axis: normal  Intervals: normal  ST/T Wave  abnormalities: normal  Conduction Disutrbances:none  Narrative Interpretation:   Old EKG Reviewed: none available     Labs Reviewed  CBC  COMPREHENSIVE METABOLIC PANEL  TSH  URINALYSIS, ROUTINE W REFLEX MICROSCOPIC  TROPONIN I   Results for orders placed during the hospital encounter of 11/10/11 (from the past 24 hour(s))  CBC     Status: Normal   Collection Time   11/10/11 11:13 AM      Component Value Range   WBC 7.5  4.0 - 10.5 K/uL   RBC 4.90  3.87 - 5.11 MIL/uL   Hemoglobin 13.3  12.0 - 15.0 g/dL   HCT 11.9  14.7 - 82.9 %   MCV 82.0  78.0 - 100.0 fL   MCH 27.1  26.0 - 34.0 pg   MCHC 33.1  30.0 - 36.0 g/dL   RDW 56.2  13.0 - 86.5 %   Platelets 347  150 - 400 K/uL  COMPREHENSIVE METABOLIC PANEL     Status: Abnormal   Collection Time   11/10/11 11:13 AM      Component Value Range   Sodium 141  135 - 145 mEq/L   Potassium 3.8  3.5 - 5.1 mEq/L   Chloride 103  96 - 112 mEq/L   CO2 27  19 - 32 mEq/L   Glucose, Bld 105 (*) 70 - 99 mg/dL   BUN 17  6 - 23 mg/dL   Creatinine, Ser 7.84  0.50 - 1.10 mg/dL   Calcium 9.4  8.4 - 69.6 mg/dL   Total Protein 7.4  6.0 - 8.3 g/dL   Albumin 3.8  3.5 - 5.2 g/dL   AST 16  0 - 37 U/L   ALT 12  0 - 35 U/L   Alkaline Phosphatase 79  39 - 117 U/L   Total Bilirubin 0.3  0.3 - 1.2 mg/dL   GFR calc non Af Amer 80 (*) >90 mL/min   GFR calc Af Amer >90  >90 mL/min  TROPONIN I     Status: Normal   Collection Time   11/10/11 11:13 AM      Component Value Range   Troponin I <0.30  <0.30 ng/mL     No results found.   1. Uncontrolled hypertension      MDM  56 yo female with history GERD and goiter presents with recurrent episodic hypertension with lightheadedness. Patient is asymptomatic and BP has resolved without medication to 150s/85. EKG negative for any ischemic changes, Will check basic labs, trop, TSH. Noted HCTZ in allergy list, so suspect this HTN is not altogether new. Will start low dose beta blocker and advise PCP follow up  for titration. Discussed with patient red flags including severe headache, neurologic signs, visual changes, syncope, chest pain to seek emergency care.        Durwin Reges, MD 11/10/11 [phone]

## 2011-11-11 LAB — TSH: TSH: 2.493 u[IU]/mL (ref 0.350–4.500)

## 2012-08-21 ENCOUNTER — Other Ambulatory Visit (INDEPENDENT_AMBULATORY_CARE_PROVIDER_SITE_OTHER): Payer: Self-pay | Admitting: Internal Medicine

## 2012-09-26 ENCOUNTER — Other Ambulatory Visit (INDEPENDENT_AMBULATORY_CARE_PROVIDER_SITE_OTHER): Payer: Self-pay | Admitting: Internal Medicine

## 2012-11-24 ENCOUNTER — Other Ambulatory Visit: Payer: Self-pay | Admitting: Obstetrics and Gynecology

## 2013-03-12 ENCOUNTER — Other Ambulatory Visit (HOSPITAL_COMMUNITY): Payer: Self-pay | Admitting: Family Medicine

## 2013-03-12 ENCOUNTER — Ambulatory Visit (HOSPITAL_COMMUNITY)
Admission: RE | Admit: 2013-03-12 | Discharge: 2013-03-12 | Disposition: A | Discharge status: Home / Self Care | Payer: BC Managed Care – PPO | Source: Ambulatory Visit | Attending: Family Medicine | Admitting: Family Medicine

## 2013-03-12 DIAGNOSIS — R05 Cough: Secondary | ICD-10-CM | POA: Insufficient documentation

## 2013-03-12 DIAGNOSIS — R079 Chest pain, unspecified: Secondary | ICD-10-CM | POA: Insufficient documentation

## 2013-03-12 DIAGNOSIS — R059 Cough, unspecified: Secondary | ICD-10-CM

## 2013-03-12 DIAGNOSIS — R509 Fever, unspecified: Secondary | ICD-10-CM | POA: Insufficient documentation

## 2013-03-12 DIAGNOSIS — R0602 Shortness of breath: Secondary | ICD-10-CM | POA: Insufficient documentation

## 2013-05-02 ENCOUNTER — Other Ambulatory Visit (INDEPENDENT_AMBULATORY_CARE_PROVIDER_SITE_OTHER): Payer: Self-pay | Admitting: Internal Medicine

## 2013-05-17 HISTORY — PX: CATARACT EXTRACTION W/ INTRAOCULAR LENS  IMPLANT, BILATERAL: SHX1307

## 2013-11-30 ENCOUNTER — Other Ambulatory Visit: Payer: Self-pay | Admitting: Obstetrics and Gynecology

## 2013-12-04 LAB — CYTOLOGY - PAP

## 2014-01-15 ENCOUNTER — Other Ambulatory Visit (INDEPENDENT_AMBULATORY_CARE_PROVIDER_SITE_OTHER): Payer: Self-pay | Admitting: Internal Medicine

## 2014-01-15 NOTE — Telephone Encounter (Signed)
Patient will need an office appointment prior to further refills. 

## 2014-03-31 ENCOUNTER — Other Ambulatory Visit (INDEPENDENT_AMBULATORY_CARE_PROVIDER_SITE_OTHER): Payer: Self-pay | Admitting: Internal Medicine

## 2014-04-01 NOTE — Telephone Encounter (Signed)
Per Dr.Rehman the patient will need to have a office visit with Terri prior to  any further . This prescription was done with 1 refill.

## 2014-04-18 NOTE — Telephone Encounter (Signed)
LM on cell voice mail.

## 2014-05-07 NOTE — Telephone Encounter (Signed)
Apt has been scheduled for 05/29/14 with Terri Setzer, NP.  

## 2014-05-29 ENCOUNTER — Telehealth (INDEPENDENT_AMBULATORY_CARE_PROVIDER_SITE_OTHER): Payer: Self-pay | Admitting: *Deleted

## 2014-05-29 ENCOUNTER — Ambulatory Visit (INDEPENDENT_AMBULATORY_CARE_PROVIDER_SITE_OTHER): Payer: BLUE CROSS/BLUE SHIELD | Admitting: Internal Medicine

## 2014-05-29 ENCOUNTER — Encounter (INDEPENDENT_AMBULATORY_CARE_PROVIDER_SITE_OTHER): Payer: Self-pay | Admitting: Internal Medicine

## 2014-05-29 ENCOUNTER — Other Ambulatory Visit (INDEPENDENT_AMBULATORY_CARE_PROVIDER_SITE_OTHER): Payer: Self-pay | Admitting: *Deleted

## 2014-05-29 DIAGNOSIS — R1310 Dysphagia, unspecified: Secondary | ICD-10-CM

## 2014-05-29 DIAGNOSIS — Z1211 Encounter for screening for malignant neoplasm of colon: Secondary | ICD-10-CM

## 2014-05-29 DIAGNOSIS — K219 Gastro-esophageal reflux disease without esophagitis: Secondary | ICD-10-CM

## 2014-05-29 DIAGNOSIS — I10 Essential (primary) hypertension: Secondary | ICD-10-CM | POA: Insufficient documentation

## 2014-05-29 MED ORDER — OMEPRAZOLE 20 MG PO CPDR: 20.0000 mg | DELAYED_RELEASE_CAPSULE | Freq: Every day | ORAL | Status: DC

## 2014-05-29 NOTE — Progress Notes (Addendum)
Subjective:    Patient ID: Kelly Watkins, female    DOB: June 10, 1955, 59 y.o.   MRN: [id number]  HPI Here today for f/u. She tells me she occasionally has dysphagia. She says for the last couple of months it is better. Foods sometimes do lodge. Acid reflux controlled with Omeprazole. Patient is interested in an EGD/ED.  She takes Omeprazole on a prn basis.  Appetite is good. No weight loss.  Usually has a BM once a daily. No melena or BRRB. Her last colonoscopy was 10 yrs ago and normal except for small internal hemorrhoids.    11/22/2003 EGD/Colonoscopy:  PROCEDURE: Esophagogastroduodenoscopy with esophageal dilatation followed by total colonoscopy.  INDICATIONS: [name] is a 59 year old Caucasian female who has an 38-month history of intermittent solid food dysphagia. She does not have heartburn. She will be 59 years old in a few months. She, therefore, also is interested in undergoing screening colonoscopy. Both procedures were reviewed with the patient and informed consent was obtained.  FINAL DIAGNOSIS: 1. 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 2. Small sliding hiatal hernia. 3. 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 Biopsy  taken for histology. 4. Gastroduodenitis. 5. Normal colonoscopy except small internal hemorrhoids.  12/10/2005 EGD/ED: INDICATIONS: [name] is a 59 year old Caucasian female with intermittent solid food dysphagia. She has history of distal esophageal ring, and her last EGD/ED was in July 2005. She had barium pill study which showed narrowing to distal esophagus and was a moderate size sliding hiatal hernia. Barium pill did passed through this area without any FINAL DIAGNOSES: 1. Two distal esophageal rings which were disrupted with 19- to 20-mm  balloon. The passage of 56-French [facility] dilator did not induce  mucosal disruption. 2. Small  sliding hiatal hernia. 3. Multiple hyperplastic gastric polyps which were less than 8 mm in  diameter. 4. Few tiny antral vascular ectasia.  Review of Systems Married, one child good health    Past Medical History  Diagnosis Date  . GERD (gastroesophageal reflux disease)   . Hypertension     Past Surgical History  Procedure Laterality Date  . Abdominal hysterectomy    . Tubal ligation    . Eye surgery      Allergies  Allergen Reactions  . Hydrochlorothiazide Itching     Current Outpatient Prescriptions on File Prior to Visit  Medication Sig Dispense Refill  . GREEN COFFEE BEAN PO Take 1 tablet by mouth 2 (two) times daily.    Marland Kitchen omeprazole (PRILOSEC) 20 MG capsule take 1 capsule by mouth once daily 30 capsule 1  . metoprolol (LOPRESSOR) 50 MG tablet Take 1 tablet (50 mg total) by mouth 2 (two) times daily. (Patient taking differently: Take 50 mg by mouth daily. ) 30 tablet 0   No current facility-administered medications on file prior to visit.        Objective:   Physical Exam  Filed Vitals:   05/29/14 1134  Height: 5\' 4"  (1.626 m)  Weight: 178 lb 9.6 oz (81.012 kg)   Alert and oriented. Skin warm and dry. Oral mucosa is moist.   . Sclera anicteric, conjunctivae is pink. Thyroid not enlarged. No cervical lymphadenopathy. Lungs clear. Heart regular rate and rhythm.  Abdomen is soft. Bowel sounds are positive. No hepatomegaly. No abdominal masses felt. No tenderness.  No edema to lower extremities.          Assessment & Plan:  Solid food dysphagia intermittently. Hx of esophageal ring.  Needs screening colonoscopy. Last colonoscopy was in 2005.

## 2014-05-29 NOTE — Patient Instructions (Addendum)
Will schedule a colonoscopy/ EGDED

## 2014-05-29 NOTE — Telephone Encounter (Signed)
Patient needs movi prep 

## 2014-05-31 MED ORDER — PEG-KCL-NACL-NASULF-NA ASC-C 100 G PO SOLR: 1.0000 | Freq: Once | ORAL | Status: DC

## 2014-06-07 ENCOUNTER — Ambulatory Visit (HOSPITAL_COMMUNITY)
Admission: RE | Admit: 2014-06-07 | Discharge: 2014-06-07 | Disposition: A | Discharge status: Home / Self Care | Payer: BLUE CROSS/BLUE SHIELD | Source: Ambulatory Visit | Attending: Internal Medicine | Admitting: Internal Medicine

## 2014-06-07 ENCOUNTER — Encounter (HOSPITAL_COMMUNITY): Payer: Self-pay | Admitting: *Deleted

## 2014-06-07 ENCOUNTER — Encounter (HOSPITAL_COMMUNITY)
Admission: RE | Disposition: A | Discharge status: Home / Self Care | Payer: Self-pay | Source: Ambulatory Visit | Attending: Internal Medicine

## 2014-06-07 DIAGNOSIS — K222 Esophageal obstruction: Secondary | ICD-10-CM | POA: Diagnosis not present

## 2014-06-07 DIAGNOSIS — K317 Polyp of stomach and duodenum: Secondary | ICD-10-CM | POA: Diagnosis not present

## 2014-06-07 DIAGNOSIS — R1310 Dysphagia, unspecified: Secondary | ICD-10-CM

## 2014-06-07 DIAGNOSIS — D371 Neoplasm of uncertain behavior of stomach: Secondary | ICD-10-CM

## 2014-06-07 DIAGNOSIS — Z888 Allergy status to other drugs, medicaments and biological substances status: Secondary | ICD-10-CM | POA: Insufficient documentation

## 2014-06-07 DIAGNOSIS — K644 Residual hemorrhoidal skin tags: Secondary | ICD-10-CM | POA: Diagnosis not present

## 2014-06-07 DIAGNOSIS — Z1211 Encounter for screening for malignant neoplasm of colon: Secondary | ICD-10-CM

## 2014-06-07 DIAGNOSIS — K219 Gastro-esophageal reflux disease without esophagitis: Secondary | ICD-10-CM | POA: Insufficient documentation

## 2014-06-07 DIAGNOSIS — K649 Unspecified hemorrhoids: Secondary | ICD-10-CM

## 2014-06-07 DIAGNOSIS — I10 Essential (primary) hypertension: Secondary | ICD-10-CM | POA: Insufficient documentation

## 2014-06-07 DIAGNOSIS — K449 Diaphragmatic hernia without obstruction or gangrene: Secondary | ICD-10-CM | POA: Insufficient documentation

## 2014-06-07 HISTORY — DX: Dysphagia, unspecified: R13.10

## 2014-06-07 HISTORY — PX: COLONOSCOPY: SHX5424

## 2014-06-07 HISTORY — PX: MALONEY DILATION: SHX5535

## 2014-06-07 HISTORY — PX: ESOPHAGOGASTRODUODENOSCOPY: SHX5428

## 2014-06-07 SURGERY — COLONOSCOPY
Anesthesia: Moderate Sedation

## 2014-06-07 MED ORDER — BUTAMBEN-TETRACAINE-BENZOCAINE 2-2-14 % EX AERO
INHALATION_SPRAY | CUTANEOUS | Status: DC | PRN
  Administered 2014-06-07: 2 via TOPICAL

## 2014-06-07 MED ORDER — MIDAZOLAM HCL 5 MG/5ML IJ SOLN
INTRAMUSCULAR | Status: DC | PRN
  Administered 2014-06-07 (×7): 2 mg via INTRAVENOUS

## 2014-06-07 MED ORDER — MIDAZOLAM HCL 5 MG/5ML IJ SOLN
INTRAMUSCULAR | Status: AC
  Filled 2014-06-07: qty 10

## 2014-06-07 MED ORDER — MIDAZOLAM HCL 5 MG/5ML IJ SOLN
INTRAMUSCULAR | Status: AC
  Filled 2014-06-07: qty 5

## 2014-06-07 MED ORDER — MEPERIDINE HCL 50 MG/ML IJ SOLN
INTRAMUSCULAR | Status: AC
  Filled 2014-06-07: qty 1

## 2014-06-07 MED ORDER — SODIUM CHLORIDE 0.9 % IV SOLN
INTRAVENOUS | Status: DC
  Administered 2014-06-07: 08:00:00 via INTRAVENOUS

## 2014-06-07 MED ORDER — STERILE WATER FOR IRRIGATION IR SOLN
Status: DC | PRN
  Administered 2014-06-07: 09:00:00

## 2014-06-07 MED ORDER — MEPERIDINE HCL 50 MG/ML IJ SOLN
INTRAMUSCULAR | Status: DC | PRN
  Administered 2014-06-07 (×2): 25 mg via INTRAVENOUS

## 2014-06-07 NOTE — Op Note (Signed)
EGD PROCEDURE REPORT  PATIENT:  Kelly Watkins  MR#:  [id number] Birthdate:  14-Aug-1955, 59 y.o., female Endoscopist:  Dr. Rogene Houston, MD Referred By:  Dr. Purvis Kilts, MD  Procedure Date: 06/07/2014  Procedure:   EGD with gastric polypectomy, ED & Colonoscopy  Indications:  Patient is 59 year old Caucasian female with chronic GERD who presents with intermittent solid food dysphagia. Her esophagus was last dilated in July 2005. She also has history of hyperplastic gastric polyps. Patient is also undergoing average risk screening colonoscopy.            Informed Consent:  The risks, benefits, alternatives & imponderables which include, but are not limited to, bleeding, infection, perforation, drug reaction and potential missed lesion have been reviewed.  The potential for biopsy, lesion removal, esophageal dilation, etc. have also been discussed.  Questions have been answered.  All parties agreeable.  Please see history & physical in medical record for more information.  Medications:  Demerol 50 mg IV Versed 14 mg IV Cetacaine spray topically for oropharyngeal anesthesia  EGD  Description of procedure:  The endoscope was introduced through the mouth and advanced to the second portion of the duodenum without difficulty or limitations. The mucosal surfaces were surveyed very carefully during advancement of the scope and upon withdrawal.  Findings:  Esophagus:  Mucosa of the esophagus was normal. Nonobstructive Schatzki's ring noted at GE junction. GEJ:  33 cm Hiatus:  37 cm Stomach:  Stomach was empty and distended very well with insufflation. Folds in the proximal stomach were normal. Examination of mucosa revealed multiple gastric polyps measuring 4 mm to 15 mm. Small polyps are also noted in herniated part of the stomach. Antral mucosa was normal. Pyloric channel was patent. Angularis was unremarkable. Polyps in the proximal stomach were also well-seen on retroflex  view. Duodenum:  Normal bulbar and post bulbar mucosa.  Therapeutic/Diagnostic Maneuvers Performed:   Esophagus was dilated by passing 56 [place] Maloney dilator to full insertion. Endoscope was passed again and no mucosal disruption noted. Ring was therefore disrupted at three sites.  Three polyps at gastric body were hot snared. Of these the largest one was 15 mm. Polypectomy sites were clean without bleeding.  These polyps are caught with Jabier Mutton net and retrieved for histologic examination.  COLONOSCOPY Description of procedure:  After a digital rectal exam was performed, that colonoscope was advanced from the anus through the rectum and colon to the area of the cecum, ileocecal valve and appendiceal orifice. The cecum was deeply intubated. These structures were well-seen and photographed for the record. From the level of the cecum and ileocecal valve, the scope was slowly and cautiously withdrawn. The mucosal surfaces were carefully surveyed utilizing scope tip to flexion to facilitate fold flattening as needed. The scope was pulled down into the rectum where a thorough exam including retroflexion was performed.  Findings:   Prep excellent. Normal mucosa of cecum, ascending colon, hepatic flexure, transverse colon, splenic flexure, descending and sigmoid colon. Normal mucosa of rectum. Hemorrhoids noted below the dentate line.  Therapeutic/Diagnostic Maneuvers Performed:  None  Complications:  None   Cecal Withdrawal Time:  8 minutes  Impression:  EGD findings; Nonobstructive Schatzki's ring dilated by passing 56 French Maloney dilator but ring had to be disrupted with focal biopsy. Small sliding hiatal hernia. Multiple gastric polyps ranging in size from 4-15 mm. Three of these polyps were hot snared and retrieved for histologic examination.  Colonoscopy findings; Small external hemorrhoids otherwise normal colonoscopy.Marland Kitchen  Recommendations:  Standard instructions given. No aspirin  or NSAIDs for 10 weeks. Patient advised to take omeprazole 20 mg daily rather than when necessary. Next screening for CRC in 10 years. I'll be contacting patient with biopsy results and further recommendations.  Shawon Denzer U  06/07/2014 9:44 AM  CC: Dr. Hilma Favors, Betsy Coder, MD & Dr. Rayne Du ref. provider found

## 2014-06-07 NOTE — Discharge Instructions (Signed)
No aspirin or NSAIDs for 10 days. Resume usual meds and diet. No driving for 24 hours. Physician will call with biopsy results.  Colonoscopy, Care After Refer to this sheet in the next few weeks. These instructions provide you with information on caring for yourself after your procedure. Your health care provider may also give you more specific instructions. Your treatment has been planned according to current medical practices, but problems sometimes occur. Call your health care provider if you have any problems or questions after your procedure. WHAT TO EXPECT AFTER THE PROCEDURE  After your procedure, it is typical to have the following:  A small amount of blood in your stool.  Moderate amounts of gas and mild abdominal cramping or bloating. HOME CARE INSTRUCTIONS  Do not drive, operate machinery, or sign important documents for 24 hours.  You may shower and resume your regular physical activities, but move at a slower pace for the first 24 hours.  Take frequent rest periods for the first 24 hours.  Walk around or put a warm pack on your abdomen to help reduce abdominal cramping and bloating.  Drink enough fluids to keep your urine clear or pale yellow.  You may resume your normal diet as instructed by your health care provider. Avoid heavy or fried foods that are hard to digest.  Avoid drinking alcohol for 24 hours or as instructed by your health care provider.  Only take over-the-counter or prescription medicines as directed by your health care provider.  If a tissue sample (biopsy) was taken during your procedure:  Do not take aspirin or blood thinners for 7 days, or as instructed by your health care provider.  Do not drink alcohol for 7 days, or as instructed by your health care provider.  Eat soft foods for the first 24 hours. SEEK MEDICAL CARE IF: You have persistent spotting of blood in your stool 2-3 days after the procedure. SEEK IMMEDIATE MEDICAL CARE IF:  You  have more than a small spotting of blood in your stool.  You pass large blood clots in your stool.  Your abdomen is swollen (distended).  You have nausea or vomiting.  You have a fever.  You have increasing abdominal pain that is not relieved with medicine. Document Released: 12/16/2003 Document Revised: 02/21/2013 Document Reviewed: 01/08/2013 [facility] Patient Information 2015 [place], [place]. This information is not intended to replace advice given to you by your health care provider. Make sure you discuss any questions you have with your health care provider. Esophagogastroduodenoscopy Care After Refer to this sheet in the next few weeks. These instructions provide you with information on caring for yourself after your procedure. Your caregiver may also give you more specific instructions. Your treatment has been planned according to current medical practices, but problems sometimes occur. Call your caregiver if you have any problems or questions after your procedure.  HOME CARE INSTRUCTIONS  Do not eat or drink anything until the numbing medicine (local anesthetic) has worn off and your gag reflex has returned. You will know that the local anesthetic has worn off when you can swallow comfortably.  Do not drive for 12 hours after the procedure or as directed by your caregiver.  Only take medicines as directed by your caregiver. SEEK MEDICAL CARE IF:   You cannot stop coughing.  You are not urinating at all or less than usual. SEEK IMMEDIATE MEDICAL CARE IF:  You have difficulty swallowing.  You cannot eat or drink.  You have worsening throat or  chest pain.  You have dizziness, lightheadedness, or you faint.  You have nausea or vomiting.  You have chills.  You have a fever.  You have severe abdominal pain.  You have black, tarry, or bloody stools. Document Released: 04/19/2012 Document Reviewed: 04/19/2012 [facility] Patient Information 2015 [place]. This  information is not intended to replace advice given to you by your health care provider. Make sure you discuss any questions you have with your health care provider.

## 2014-06-07 NOTE — H&P (Signed)
Kelly Watkins is an 58 y.o. female.   Chief Complaint: Patient is here for EGD, ED and colonoscopy. HPI: She is 4-year-old Caucasian female was chronic GERD now presents with intermittent solid food dysphagia. She's been having difficulty swallowing bread and meats for about a year. She denies nausea vomiting abdominal pain or melena. She is also undergoing screening colonoscopy. She did notice some blood this morning after she took an enema. Last EGD ED and colonoscopy was in July 2005. Family history is negative for CRC.  Past Medical History  Diagnosis Date  . GERD (gastroesophageal reflux disease)   . Hypertension   . Dysphagia     Past Surgical History  Procedure Laterality Date  . Abdominal hysterectomy    . Tubal ligation    . Eye surgery    . Colonoscopy    . Esophagogastroduodenoscopy (egd) with esophageal dilation      X 2    Family History  Problem Relation Age of Onset  . Hypertension Mother    Social History:  reports that she has never smoked. She does not have any smokeless tobacco history on file. She reports that she does not drink alcohol or use illicit drugs.  Allergies:  Allergies  Allergen Reactions  . Hydrochlorothiazide Itching    Medications Prior to Admission  Medication Sig Dispense Refill  . CINNAMON PO Take 1 capsule by mouth daily.    . Estradiol 10 MCG TABS vaginal tablet Place vaginally 2 (two) times a week.    . GARCINIA CAMBOGIA-CHROMIUM PO Take 1 capsule by mouth daily.    . GREEN COFFEE BEAN PO Take 1 tablet by mouth 2 (two) times daily.    . KRILL OIL PO Take 1 capsule by mouth daily.    . metoprolol (LOPRESSOR) 50 MG tablet Take 1 tablet (50 mg total) by mouth 2 (two) times daily. (Patient taking differently: Take 50 mg by mouth daily. ) 30 tablet 0  . omeprazole (PRILOSEC) 20 MG capsule Take 1 capsule (20 mg total) by mouth daily. 30 capsule 11  . peg 3350 powder (MOVIPREP) 100 G SOLR Take 1 kit (200 g total) by mouth once. 1 kit 0     No results found for this or any previous visit (from the past 48 hour(s)). No results found.  ROS  Blood pressure 148/97, pulse 89, temperature 97.9 F (36.6 C), temperature source Oral, resp. rate 14, height 5' 4" (1.626 m), weight 178 lb (80.74 kg), SpO2 98 %. Physical Exam  Constitutional: She appears well-developed and well-nourished.  HENT:  Mouth/Throat: Oropharynx is clear and moist.  Eyes: Conjunctivae are normal. No scleral icterus.  Neck: No thyromegaly present.  Cardiovascular: Normal rate and normal heart sounds.   No murmur heard. Respiratory: Effort normal and breath sounds normal.  GI: Soft. She exhibits no distension and no mass. There is no tenderness.  Musculoskeletal: She exhibits no edema.  Lymphadenopathy:    She has no cervical adenopathy.  Neurological: She is alert.  Skin: Skin is warm and dry.     Assessment/Plan Solid food dysphagia in a patient with chronic GERD. EGD with ED and average risk screening colonoscopy.  REHMAN,NAJEEB U 06/07/2014, 8:35 AM    

## 2014-06-10 ENCOUNTER — Encounter (HOSPITAL_COMMUNITY): Payer: Self-pay | Admitting: Internal Medicine

## 2014-06-13 ENCOUNTER — Other Ambulatory Visit (INDEPENDENT_AMBULATORY_CARE_PROVIDER_SITE_OTHER): Payer: Self-pay | Admitting: Internal Medicine

## 2014-06-13 DIAGNOSIS — K317 Polyp of stomach and duodenum: Secondary | ICD-10-CM

## 2014-06-13 MED ORDER — RANITIDINE HCL 300 MG PO TABS: 300.0000 mg | ORAL_TABLET | Freq: Two times a day (BID) | ORAL | Status: DC

## 2014-06-14 ENCOUNTER — Encounter (INDEPENDENT_AMBULATORY_CARE_PROVIDER_SITE_OTHER): Payer: Self-pay | Admitting: *Deleted

## 2014-06-14 ENCOUNTER — Telehealth (INDEPENDENT_AMBULATORY_CARE_PROVIDER_SITE_OTHER): Payer: Self-pay | Admitting: *Deleted

## 2014-06-14 DIAGNOSIS — K219 Gastro-esophageal reflux disease without esophagitis: Secondary | ICD-10-CM

## 2014-06-14 DIAGNOSIS — R1310 Dysphagia, unspecified: Secondary | ICD-10-CM

## 2014-06-14 NOTE — Telephone Encounter (Signed)
Per Dr.Rehman the patient will need to have labs drawn. 

## 2014-06-17 ENCOUNTER — Telehealth (INDEPENDENT_AMBULATORY_CARE_PROVIDER_SITE_OTHER): Payer: Self-pay | Admitting: *Deleted

## 2014-06-17 NOTE — Telephone Encounter (Signed)
Kelly Watkins spoke with Dr. Laural Golden on Friday and he is wanting to get some lab work. [name] is not in network with her insurance company and would like the order sent to [facility]. Her return phone number is there are any questions is [phone].

## 2014-06-18 NOTE — Telephone Encounter (Signed)
A order for the Gastrin Level (serum) has been sent to [facility] ,and the patient has been called and made aware.

## 2014-06-26 ENCOUNTER — Telehealth (INDEPENDENT_AMBULATORY_CARE_PROVIDER_SITE_OTHER): Payer: Self-pay | Admitting: *Deleted

## 2014-06-26 NOTE — Telephone Encounter (Signed)
Per Dr.Rehman the patient will need to have an  Office appointment in 3 months.

## 2014-07-02 NOTE — Telephone Encounter (Signed)
Apt has been scheduled for 09/25/14 with Deberah Castle, NP.

## 2014-09-25 ENCOUNTER — Ambulatory Visit (INDEPENDENT_AMBULATORY_CARE_PROVIDER_SITE_OTHER): Payer: BLUE CROSS/BLUE SHIELD | Admitting: Internal Medicine

## 2014-10-02 ENCOUNTER — Ambulatory Visit (INDEPENDENT_AMBULATORY_CARE_PROVIDER_SITE_OTHER): Payer: BLUE CROSS/BLUE SHIELD | Admitting: Internal Medicine

## 2014-10-16 ENCOUNTER — Encounter (INDEPENDENT_AMBULATORY_CARE_PROVIDER_SITE_OTHER): Payer: Self-pay | Admitting: *Deleted

## 2014-10-18 ENCOUNTER — Ambulatory Visit (HOSPITAL_COMMUNITY)
Admission: RE | Admit: 2014-10-18 | Discharge: 2014-10-18 | Disposition: A | Discharge status: Home / Self Care | Payer: BLUE CROSS/BLUE SHIELD | Source: Ambulatory Visit | Attending: Family Medicine | Admitting: Family Medicine

## 2014-10-18 ENCOUNTER — Other Ambulatory Visit (HOSPITAL_COMMUNITY): Payer: Self-pay | Admitting: Family Medicine

## 2014-10-18 DIAGNOSIS — R05 Cough: Secondary | ICD-10-CM | POA: Insufficient documentation

## 2014-10-18 DIAGNOSIS — R0602 Shortness of breath: Secondary | ICD-10-CM | POA: Diagnosis not present

## 2014-10-18 DIAGNOSIS — R062 Wheezing: Secondary | ICD-10-CM

## 2014-11-22 ENCOUNTER — Ambulatory Visit: Payer: BLUE CROSS/BLUE SHIELD | Admitting: Cardiology

## 2015-01-29 ENCOUNTER — Other Ambulatory Visit: Payer: Self-pay | Admitting: Obstetrics and Gynecology

## 2015-01-30 LAB — CYTOLOGY - PAP

## 2015-06-17 ENCOUNTER — Other Ambulatory Visit (INDEPENDENT_AMBULATORY_CARE_PROVIDER_SITE_OTHER): Payer: Self-pay | Admitting: Internal Medicine

## 2015-06-24 ENCOUNTER — Ambulatory Visit (HOSPITAL_COMMUNITY): Payer: BLUE CROSS/BLUE SHIELD | Admitting: Oncology

## 2015-06-24 ENCOUNTER — Ambulatory Visit (HOSPITAL_COMMUNITY): Payer: BLUE CROSS/BLUE SHIELD | Admitting: Hematology & Oncology

## 2015-06-25 ENCOUNTER — Encounter (HOSPITAL_COMMUNITY): Payer: Managed Care, Other (non HMO) | Attending: Hematology & Oncology | Admitting: Hematology & Oncology

## 2015-06-25 ENCOUNTER — Encounter (HOSPITAL_COMMUNITY): Payer: Self-pay | Admitting: Hematology & Oncology

## 2015-06-25 VITALS — BP 144/86 | HR 84 | Temp 98.0°F | Resp 18 | Ht 64.0 in | Wt 174.0 lb

## 2015-06-25 DIAGNOSIS — D473 Essential (hemorrhagic) thrombocythemia: Secondary | ICD-10-CM | POA: Insufficient documentation

## 2015-06-25 DIAGNOSIS — Z79899 Other long term (current) drug therapy: Secondary | ICD-10-CM | POA: Diagnosis not present

## 2015-06-25 DIAGNOSIS — K219 Gastro-esophageal reflux disease without esophagitis: Secondary | ICD-10-CM | POA: Diagnosis not present

## 2015-06-25 DIAGNOSIS — D75839 Thrombocytosis, unspecified: Secondary | ICD-10-CM

## 2015-06-25 DIAGNOSIS — M1990 Unspecified osteoarthritis, unspecified site: Secondary | ICD-10-CM | POA: Diagnosis not present

## 2015-06-25 DIAGNOSIS — I10 Essential (primary) hypertension: Secondary | ICD-10-CM | POA: Insufficient documentation

## 2015-06-25 LAB — CBC WITH DIFFERENTIAL/PLATELET
Basophils Absolute: 0.1 10*3/uL (ref 0.0–0.1)
Basophils Relative: 1 %
EOS ABS: 0.2 10*3/uL (ref 0.0–0.7)
Eosinophils Relative: 2 %
HCT: 39.7 % (ref 36.0–46.0)
HEMOGLOBIN: 13.2 g/dL (ref 12.0–15.0)
Lymphocytes Relative: 28 %
Lymphs Abs: 2.4 10*3/uL (ref 0.7–4.0)
MCH: 27.7 pg (ref 26.0–34.0)
MCHC: 33.2 g/dL (ref 30.0–36.0)
MCV: 83.2 fL (ref 78.0–100.0)
MONOS PCT: 8 %
Monocytes Absolute: 0.7 10*3/uL (ref 0.1–1.0)
Neutro Abs: 5.4 10*3/uL (ref 1.7–7.7)
Neutrophils Relative %: 61 %
PLATELETS: 389 10*3/uL (ref 150–400)
RBC: 4.77 MIL/uL (ref 3.87–5.11)
RDW: 12.9 % (ref 11.5–15.5)
WBC: 8.7 10*3/uL (ref 4.0–10.5)

## 2015-06-25 LAB — IRON AND TIBC
IRON: 41 ug/dL (ref 28–170)
Saturation Ratios: 9 % — ABNORMAL LOW (ref 10.4–31.8)
TIBC: 441 ug/dL (ref 250–450)
UIBC: 400 ug/dL

## 2015-06-25 LAB — TSH: TSH: 1.995 u[IU]/mL (ref 0.350–4.500)

## 2015-06-25 LAB — SEDIMENTATION RATE: SED RATE: 40 mm/h — AB (ref 0–22)

## 2015-06-25 LAB — SAVE SMEAR

## 2015-06-25 LAB — FERRITIN: FERRITIN: 20 ng/mL (ref 11–307)

## 2015-06-25 LAB — C-REACTIVE PROTEIN: CRP: <0.5 mg/dL (ref <1.0)

## 2015-06-25 LAB — FOLATE: Folate: 14.6 ng/mL (ref >5.9)

## 2015-06-25 NOTE — Patient Instructions (Signed)
[place] [place] at [facility] Discharge Instructions  RECOMMENDATIONS MADE BY THE CONSULTANT AND ANY TEST RESULTS WILL BE SENT TO YOUR REFERRING PHYSICIAN.   Exam and discussion by Dr Whitney Muse today. Causes of high platelets: could just be you, people inflammatory responses, if your body is making too many platelets (essential thrombocytosis).  We do a physical and lab work.  Return to see the doctor in 2 weeks. Please call the clinic if you have any questions or concerns.    Thank you for choosing [place] at [facility] to provide your oncology and hematology care.  To afford each patient quality time with our provider, please arrive at least 15 minutes before your scheduled appointment time.   Beginning January 23rd 2017 lab work for the [facility] will be done in the  Main lab at [facility] on 1st floor. If you have a lab appointment with the [facility] please come in thru the  Main Entrance and check in at the main information desk  You need to re-schedule your appointment should you arrive 10 or more minutes late.  We strive to give you quality time with our providers, and arriving late affects you and other patients whose appointments are after yours.  Also, if you no show three or more times for appointments you may be dismissed from the clinic at the providers discretion.     Again, thank you for choosing [facility].  Our hope is that these requests will decrease the amount of time that you wait before being seen by our physicians.       _____________________________________________________________  Should you have questions after your visit to [facility], please contact our office at [phone] between the hours of 8:30 a.m. and 4:30 p.m.  Voicemails left after 4:30 p.m. will not be returned until the following business day.  For prescription refill requests, have your pharmacy contact our office.

## 2015-06-25 NOTE — Progress Notes (Signed)
..  Jovani C Arey's reason for visit today are for labs as scheduled per MD orders.  Venipuncture performed with a 23 gauge butterfly needle to L Antecubital.  Renae C Waites tolerated venipuncture well and without incident; questions were answered and patient was discharged.

## 2015-06-25 NOTE — Progress Notes (Signed)
[place] at [facility] NOTE  Patient Care Team: Sharilyn Sites, MD as PCP - General (Family Medicine)  CHIEF COMPLAINTS/PURPOSE OF CONSULTATION:  Thrombocytosis  HISTORY OF PRESENTING ILLNESS:  Kelly Watkins 59 y.o. female is here because of abnormal blood counts.  Kelly Watkins is here alone.  She has been doing well, with only complaint being the normals aches and pains of aging. Denies any issues breathing, except for sometimes while going up and downstairs. Admits that she is not very active. Denies any bleeding or bruising she cannot explain or nose bleeds. She has been through menopause. She had a hysterectomy and they took her ovaries.   In regards to allergies she reports that for several years she had sinus infections frequently. After she had hard wood floors put in throughout her house, these sinus infections have resolved. Reports that if she goes into a department store clothing section she begins to feel itchy and has to go home to take a benadryl. She is not sure if it is the dyes in the clothing or allergies.  Denies having contact dermatitis or eczema.   Recently found to have arthritis in her Right hip. Denies rheumatoid arthritis.   She is curious if her high platelets could have been caused by stress as her husband had a heart attack 6 years ago and the past several years have been stressful.   She was found to have thyroiditis many years ago at [place].  She is up to date of her mammograms, receiving them at [facility].   She is here today for further evaluation of thrombocytosis.  Laboratory studies from [place] dating back to 2010 are as follows: 05/14/2009 platelet count of 436 K, normal H/H and WBC count 10/16/2012 platelet count of 410 K, normal H/H and WBC count 05/29/2014 platelet count of 442 K, normal H/H and WBC count 05/23/2015 platelet count of 424 K normal H/H and WBC count  She denies headaches, blurry  vision.  MEDICAL HISTORY:  Past Medical History  Diagnosis Date  . GERD (gastroesophageal reflux disease)   . Hypertension   . Dysphagia     SURGICAL HISTORY: Past Surgical History  Procedure Laterality Date  . Abdominal hysterectomy    . Tubal ligation    . Eye surgery    . Colonoscopy    . Esophagogastroduodenoscopy (egd) with esophageal dilation      X 2  . Colonoscopy N/A 06/07/2014    Procedure: COLONOSCOPY;  Surgeon: Rogene Houston, MD;  [facility];  Service: Endoscopy;  Laterality: N/A;  1250  . Esophagogastroduodenoscopy N/A 06/07/2014    Procedure: ESOPHAGOGASTRODUODENOSCOPY (EGD);  Surgeon: Rogene Houston, MD;  [facility];  Service: Endoscopy;  Laterality: N/A;  [name] dilation N/A 06/07/2014    Procedure: [name] DILATION;  Surgeon: Rogene Houston, MD;  [facility];  Service: Endoscopy;  Laterality: N/A;  . Mandible surgery  1987    SOCIAL HISTORY: Social History   Social History  . Marital Status: Married    Spouse Name: N/A  . Number of Children: N/A  . Years of Education: N/A   Occupational History  . Not on file.   Social History Main Topics  . Smoking status: Never Smoker   . Smokeless tobacco: Not on file  . Alcohol Use: No  . Drug Use: No  . Sexual Activity: Not on file   Other Topics Concern  . Not on  file   Social History Narrative  Married 1 child 1 grandchild Works at [employer] in [place] smoker. ETOH, none No hobbies currently. Spends time with her granddaughter. She is originally from [place]: Family History  Problem Relation Age of Onset  . Hypertension Mother    indicated that her mother is alive. She indicated that her father is alive. She indicated that her sister is alive.   Mother was very petite, in her last 89 years she was sick with dementia, heart problems, and a spot on her lung. She passed away this past year at 62 yo. Was a smoker Father is still  living. He will be 82 in April. In good health. 2 sisters, fairly healthy  ALLERGIES:  is allergic to hydrochlorothiazide.  MEDICATIONS:  Current Outpatient Prescriptions  Medication Sig Dispense Refill  . CINNAMON PO Take 1 capsule by mouth daily.    . Estradiol 10 MCG TABS vaginal tablet Place vaginally 2 (two) times a week.    Marland Kitchen KRILL OIL PO Take 2 capsules by mouth daily.     . metoprolol (LOPRESSOR) 50 MG tablet Take 1 tablet (50 mg total) by mouth 2 (two) times daily. (Patient taking differently: Take 50 mg by mouth daily. ) 30 tablet 0  . omeprazole (PRILOSEC) 20 MG capsule take 1 capsule by mouth once daily 30 capsule 11  . phentermine (ADIPEX-P) 37.5 MG tablet     . furosemide (LASIX) 20 MG tablet Take 20 mg by mouth as needed.    . Iron Polysacch Cmplx-B12-FA 150-0.025-1 MG CAPS Take 150 mg by mouth daily. 30 each 3   No current facility-administered medications for this visit.    Review of Systems  Constitutional: Negative.   HENT: Negative.   Eyes: Negative.   Respiratory: Negative.   Cardiovascular: Negative.   Gastrointestinal: Negative.   Genitourinary: Negative.   Musculoskeletal: Positive for joint pain.       Right hip OA  Skin: Negative.   Neurological: Negative.   Endo/Heme/Allergies: Negative.   Psychiatric/Behavioral: Negative.   All other systems reviewed and are negative.  14 point ROS was done and is otherwise as detailed above or in HPI  PHYSICAL EXAMINATION: ECOG PERFORMANCE STATUS: 0 - Asymptomatic  Filed Vitals:   06/25/15 1442  BP: 144/86  Pulse: 84  Temp: 98 F (36.7 C)  Resp: 18   Filed Weights   06/25/15 1442  Weight: 174 lb (78.926 kg)   Physical Exam  Constitutional: She is oriented to person, place, and time and well-developed, well-nourished, and in no distress.  HENT:  Head: Normocephalic and atraumatic.  Nose: Nose normal.  Mouth/Throat: Oropharynx is clear and moist. No oropharyngeal exudate.  Eyes: Conjunctivae and  EOM are normal. Pupils are equal, round, and reactive to light. Right eye exhibits no discharge. Left eye exhibits no discharge. No scleral icterus.  Neck: Normal range of motion. Neck supple. No tracheal deviation present. No thyromegaly present.  Cardiovascular: Normal rate, regular rhythm and normal heart sounds.  Exam reveals no gallop and no friction rub.   No murmur heard. Pulmonary/Chest: Effort normal and breath sounds normal. She has no wheezes. She has no rales.  Abdominal: Soft. Bowel sounds are normal. She exhibits no distension and no mass. There is no tenderness. There is no rebound and no guarding.  Musculoskeletal: Normal range of motion. She exhibits no edema.  Lymphadenopathy:    She has no cervical adenopathy.  Neurological: She is alert and oriented to  person, place, and time. She has normal reflexes. No cranial nerve deficit. Gait normal. Coordination normal.  Skin: Skin is warm and dry. No rash noted.  Psychiatric: Mood, memory, affect and judgment normal.  Nursing note and vitals reviewed.  LABORATORY DATA:  I have reviewed the data as listed CMP     Component Value Date/Time   NA 141 11/10/2011 1113   K 3.8 11/10/2011 1113   CL 103 11/10/2011 1113   CO2 27 11/10/2011 1113   GLUCOSE 105* 11/10/2011 1113   BUN 17 11/10/2011 1113   CREATININE 0.80 11/10/2011 1113   CALCIUM 9.4 11/10/2011 1113   PROT 7.4 11/10/2011 1113   ALBUMIN 3.8 11/10/2011 1113   AST 16 11/10/2011 1113   ALT 12 11/10/2011 1113   ALKPHOS 79 11/10/2011 1113   BILITOT 0.3 11/10/2011 1113   GFRNONAA 80* 11/10/2011 1113   GFRAA >90 11/10/2011 1113   RADIOGRAPHIC STUDIES: I have personally reviewed the radiological images as listed and agreed with the findings in the report. CLINICAL DATA: Wheezing at night for 1 month, with some associated shortness of breath on exertion, and cough.  EXAM: CHEST 2 VIEW  COMPARISON: 03/12/2013  FINDINGS: The heart size and mediastinal contours  are within normal limits. Both lungs are clear. There is a rounded gas containing density overlying the mid lower thorax, which likely represents a hiatal hernia. The visualized skeletal structures are unremarkable.  IMPRESSION: No radiographic evidence of active cardiopulmonary disease.  Probable hiatal hernia. Endoscopic evaluation or evaluation with upper GI fluoroscopy may be considered.   Electronically Signed  By: Fidela Salisbury M.D.  On: 10/18/2014 16:14  ASSESSMENT & PLAN:  Thrombocytosis   I discussed causes of thrombocytosis with the patient. The initial diagnostic question is whether thrombocytosis is a reactive phenomenon or a marker for the presence of a hematologic disorder Causes of reactive thrombocytosis include iron deficiency, B12 deficiency, malignancy, rheumatologic disorders, chronic infection, allergies, reaction to medications. Etc.  If a reactive process has been ruled out, the next step is to accurately classify the thrombocytosis as being due to one of the defined myeloproliferative or myelodysplastic disorders. In general, autonomous thrombocytosis (AT) is a reasonable diagnostic possibility in a patient with chronic thrombocytosis, normal iron stores, and an intact spleen.  I advised her that it is recommended that in all patients in whom a reactive process cannot be identified a bone marrow examination with reticulin staining and cytogenetic studies would be recommended  There are no laboratory findings which are pathognomonic for ET. Thus, ET is the working diagnosis when autonomous or clonal thrombocytosis occurs in the absence of the diagnostic features noted above  Diagnostic criteria for ET proposed by the [facility]  requires a sustained platelet count of over 450,000/microL . The rate of cytogenetic abnormalities is less than 5 percent, although the JAK2 V617F mutation is seen in approximately 50 percent of patients with ET.  I have ordered an  IgE panel, ANA, CMP, IgG, SED rate, ferritin, CBC with diff and peripheral center, JAK 2 with reflex, TSH, pathology smear. Follow up to review blood work and genetic testing  We will request additional labs from [facility].   She will return in about 2 weeks to review these results.  Orders Placed This Encounter  Procedures  . CBC with Differential    Standing Status: Future     Number of Occurrences: 1     Standing Expiration Date: 06/24/2016  . Pathologist smear review    Standing Status: Future  Number of Occurrences: 1     Standing Expiration Date: 06/24/2016  . Ferritin    Standing Status: Future     Number of Occurrences: 1     Standing Expiration Date: 06/24/2016  . Iron and TIBC    Standing Status: Future     Number of Occurrences: 1     Standing Expiration Date: 06/24/2016  . Folate    Standing Status: Future     Number of Occurrences: 1     Standing Expiration Date: 06/24/2016  . JAK2 V617F, Rfx CALR/E12/MPL    Standing Status: Future     Number of Occurrences: 1     Standing Expiration Date: 06/24/2016  . IgG, IgA, IgM    Standing Status: Future     Number of Occurrences: 1     Standing Expiration Date: 06/24/2016  . C-reactive protein    Standing Status: Future     Number of Occurrences: 1     Standing Expiration Date: 06/24/2016  . ANA, IFA (with reflex)  . Sedimentation rate    Standing Status: Future     Number of Occurrences: 1     Standing Expiration Date: 06/24/2016  . Save smear  . TSH  . IGE  . CALR + JAK2 EXON12 + MPL    All questions were answered. The patient knows to call the clinic with any problems, questions or concerns.  This document serves as a record of services personally performed by Ancil Linsey, MD. It was created on her behalf by Arlyce Harman, a trained medical scribe. The creation of this record is based on the scribe's personal observations and the provider's statements to them. This document has been checked and approved by the  attending provider.  I have reviewed the above documentation for accuracy and completeness, and I agree with the above.  This note was electronically signed.    Molli Hazard, MD  07/10/2015 7:55 AM

## 2015-06-26 LAB — IGG, IGA, IGM
IgA: 429 mg/dL — ABNORMAL HIGH (ref 87–352)
IgG (Immunoglobin G), Serum: 877 mg/dL (ref 700–1600)
IgM, Serum: 78 mg/dL (ref 26–217)

## 2015-06-26 LAB — ANTINUCLEAR ANTIBODIES, IFA: ANA Ab, IFA: NEGATIVE

## 2015-06-26 LAB — IGE: IgE (Immunoglobulin E), Serum: 24 IU/mL (ref 0–100)

## 2015-06-27 MED ORDER — IRON POLYSACCH CMPLX-B12-FA 150-0.025-1 MG PO CAPS: 150.0000 mg | ORAL_CAPSULE | Freq: Every day | ORAL | Status: DC

## 2015-07-01 ENCOUNTER — Encounter (HOSPITAL_COMMUNITY): Payer: Self-pay | Admitting: *Deleted

## 2015-07-05 LAB — CALR + JAK2 E12-15 + MPL (REFLEXED)

## 2015-07-05 LAB — JAK2 V617F, W REFLEX TO CALR/E12/MPL

## 2015-07-09 ENCOUNTER — Encounter (HOSPITAL_BASED_OUTPATIENT_CLINIC_OR_DEPARTMENT_OTHER): Payer: Managed Care, Other (non HMO) | Admitting: Hematology & Oncology

## 2015-07-09 ENCOUNTER — Encounter (HOSPITAL_COMMUNITY): Payer: Self-pay | Admitting: Hematology & Oncology

## 2015-07-09 VITALS — BP 148/76 | HR 92 | Temp 98.0°F | Resp 18 | Wt 173.4 lb

## 2015-07-09 DIAGNOSIS — D75839 Thrombocytosis, unspecified: Secondary | ICD-10-CM

## 2015-07-09 DIAGNOSIS — D473 Essential (hemorrhagic) thrombocythemia: Secondary | ICD-10-CM | POA: Diagnosis not present

## 2015-07-09 DIAGNOSIS — E611 Iron deficiency: Secondary | ICD-10-CM

## 2015-07-09 NOTE — Progress Notes (Signed)
Giliana C Lasure's reason for visit today is for labs as scheduled per MD orders.  Venipuncture performed with a 23 gauge butterfly needle to L Antecubital.  Kelly Watkins tolerated procedure well and without incident; questions were answered and patient was discharged.

## 2015-07-09 NOTE — Patient Instructions (Addendum)
[place] at [facility] Discharge Instructions  RECOMMENDATIONS MADE BY THE CONSULTANT AND ANY TEST RESULTS WILL BE SENT TO YOUR REFERRING PHYSICIAN.   Exam and discussion by Dr Whitney Muse today Lab work today If you start to have any abdominal pain or constipation, or cant tolerate your iron please let us know Return to see the doctor in 6 weeks with lab work Please call the clinic if you have any questions or concerns     Thank you for choosing [place] at [facility] to provide your oncology and hematology care.  To afford each patient quality time with our provider, please arrive at least 15 minutes before your scheduled appointment time.   Beginning January 23rd 2017 lab work for the [facility] will be done in the  Main lab at [facility] on 1st floor. If you have a lab appointment with the [facility] please come in thru the  Main Entrance and check in at the main information desk  You need to re-schedule your appointment should you arrive 10 or more minutes late.  We strive to give you quality time with our providers, and arriving late affects you and other patients whose appointments are after yours.  Also, if you no show three or more times for appointments you may be dismissed from the clinic at the providers discretion.     Again, thank you for choosing [facility].  Our hope is that these requests will decrease the amount of time that you wait before being seen by our physicians.       _____________________________________________________________  Should you have questions after your visit to [facility], please contact our office at [phone] between the hours of 8:30 a.m. and 4:30 p.m.  Voicemails left after 4:30 p.m. will not be returned until the following business day.  For prescription refill requests, have your pharmacy contact our office.

## 2015-07-10 ENCOUNTER — Ambulatory Visit (HOSPITAL_COMMUNITY): Payer: Managed Care, Other (non HMO) | Admitting: Hematology & Oncology

## 2015-07-10 DIAGNOSIS — D75839 Thrombocytosis, unspecified: Secondary | ICD-10-CM | POA: Insufficient documentation

## 2015-07-10 DIAGNOSIS — D473 Essential (hemorrhagic) thrombocythemia: Secondary | ICD-10-CM | POA: Insufficient documentation

## 2015-07-10 LAB — IMMUNOFIXATION ELECTROPHORESIS
IGA: 387 mg/dL — AB (ref 87–352)
IgG (Immunoglobin G), Serum: 848 mg/dL (ref 700–1600)
IgM, Serum: 75 mg/dL (ref 26–217)
Total Protein ELP: 6.9 g/dL (ref 6.0–8.5)

## 2015-07-10 LAB — PROTEIN ELECTROPHORESIS, SERUM
A/G Ratio: 1.1 (ref 0.7–1.7)
ALPHA-2-GLOBULIN: 0.8 g/dL (ref 0.4–1.0)
Albumin ELP: 3.4 g/dL (ref 2.9–4.4)
Alpha-1-Globulin: 0.2 g/dL (ref 0.0–0.4)
Beta Globulin: 1.4 g/dL — ABNORMAL HIGH (ref 0.7–1.3)
Gamma Globulin: 0.8 g/dL (ref 0.4–1.8)
Globulin, Total: 3.2 g/dL (ref 2.2–3.9)
Total Protein ELP: 6.6 g/dL (ref 6.0–8.5)

## 2015-07-10 NOTE — Progress Notes (Signed)
[name] NOTE  Patient Care Team: Sharilyn Sites, MD as PCP - General (Family Medicine)  CHIEF COMPLAINTS/PURPOSE OF CONSULTATION:  Thrombocytosis Iron deficiency with serum ferritin 20 ng/ml, iron saturation at 9% Mild elevation of IGA  HISTORY OF PRESENTING ILLNESS:  Kelly Watkins 60 y.o. female is here because of abnormal blood counts.  Kelly Watkins is here alone.  She began taking the oral iron on the day it was prescribed. She takes it with food, as recommended by the pharmacist, and experiences some mild stomach discomfort "but not really". She experiences some mild constipation but it is not problematic. Marland Kitchen  Her last EGD was in January 2016 with Dr. Laural Golden. Reports he found some polyps that he felt was from taking prilosec.  Notes that her mother and several aunts had iron absorption problems. Her mother saw a doctor in [place] who did an EGD to cauterize "bleeding vessels."   MEDICAL HISTORY:  Past Medical History  Diagnosis Date  . GERD (gastroesophageal reflux disease)   . Hypertension   . Dysphagia     SURGICAL HISTORY: Past Surgical History  Procedure Laterality Date  . Abdominal hysterectomy    . Tubal ligation    . Eye surgery    . Colonoscopy    . Esophagogastroduodenoscopy (egd) with esophageal dilation      X 2  . Colonoscopy N/A 06/07/2014    Procedure: COLONOSCOPY;  Surgeon: Rogene Houston, MD;  [facility];  Service: Endoscopy;  Laterality: N/A;  1250  . Esophagogastroduodenoscopy N/A 06/07/2014    Procedure: ESOPHAGOGASTRODUODENOSCOPY (EGD);  Surgeon: Rogene Houston, MD;  [facility];  Service: Endoscopy;  Laterality: N/A;  [name] dilation N/A 06/07/2014    Procedure: [name] DILATION;  Surgeon: Rogene Houston, MD;  [facility];  Service: Endoscopy;  Laterality: N/A;  . Mandible surgery  1987    SOCIAL HISTORY: Social History   Social History  . Marital Status: Married    Spouse Name: N/A  . Number of Children: N/A  . Years of Education: N/A   Occupational History  . Not on file.   Social History Main Topics  . Smoking status: Never Smoker   . Smokeless tobacco: Not on file  . Alcohol Use: No  . Drug Use: No  . Sexual Activity: Not on file   Other Topics Concern  . Not on file   Social History Narrative  Married 1 child 1 grandchild Works at [employer] in [place] smoker. ETOH, none No hobbies currently. Spends time with her granddaughter. She is originally from [place]: Family History  Problem Relation Age of Onset  . Hypertension Mother    indicated that her mother is alive. She indicated that her father is alive. She indicated that her sister is alive.   Mother was very petite, in her last 85 years she was sick with dementia, heart problems, and a spot on her lung. She passed away this past year at 62 yo. Was a smoker Father is still living. He will be 82 in April. In good health. 2 sisters, fairly healthy  ALLERGIES:  is allergic to hydrochlorothiazide.  MEDICATIONS:  Current Outpatient Prescriptions  Medication Sig Dispense Refill  . furosemide (LASIX) 20 MG tablet Take 20 mg by mouth as needed.    . Iron Polysacch Cmplx-B12-FA 150-0.025-1 MG CAPS Take 150 mg by mouth daily. 30 each 3  . KRILL OIL  PO Take 2 capsules by mouth daily.     . metoprolol (LOPRESSOR) 50 MG tablet Take 1 tablet (50 mg total) by mouth 2 (two) times daily. (Patient taking differently: Take 50 mg by mouth daily. ) 30 tablet 0  . omeprazole (PRILOSEC) 20 MG capsule take 1 capsule by mouth once daily 30 capsule 11  . phentermine (ADIPEX-P) 37.5 MG tablet     . CINNAMON PO Take 1 capsule by mouth daily.    . Estradiol 10 MCG TABS vaginal tablet Place vaginally 2 (two) times a week.     No current facility-administered medications for this visit.    Review of Systems  Constitutional: Negative.   HENT: Negative.   Eyes: Negative.     Respiratory: Negative.   Cardiovascular: Negative.   Gastrointestinal: Negative.   Genitourinary: Negative.   Musculoskeletal: Positive for joint pain.       Right hip OA  Skin: Negative.   Neurological: Negative.   Endo/Heme/Allergies: Negative.   Psychiatric/Behavioral: Negative.   All other systems reviewed and are negative.  14 point ROS was done and is otherwise as detailed above or in HPI  PHYSICAL EXAMINATION: ECOG PERFORMANCE STATUS: 0 - Asymptomatic  Filed Vitals:   07/09/15 0827  BP: 148/76  Pulse: 92  Temp: 98 F (36.7 C)  Resp: 18   Filed Weights   07/09/15 0827  Weight: 173 lb 6.4 oz (78.654 kg)   Physical Exam  Constitutional: She is oriented to person, place, and time and well-developed, well-nourished, and in no distress.  HENT:  Head: Normocephalic and atraumatic.  Nose: Nose normal.  Mouth/Throat: Oropharynx is clear and moist. No oropharyngeal exudate.  Eyes: Conjunctivae and EOM are normal. Pupils are equal, round, and reactive to light. Right eye exhibits no discharge. Left eye exhibits no discharge. No scleral icterus.  Neck: Normal range of motion. Neck supple. No tracheal deviation present. No thyromegaly present.  Cardiovascular: Normal rate, regular rhythm and normal heart sounds.  Exam reveals no gallop and no friction rub.   No murmur heard. Pulmonary/Chest: Effort normal and breath sounds normal. She has no wheezes. She has no rales.  Abdominal: Soft. Bowel sounds are normal. She exhibits no distension and no mass. There is no tenderness. There is no rebound and no guarding.  Musculoskeletal: Normal range of motion. She exhibits no edema.  Lymphadenopathy:    She has no cervical adenopathy.  Neurological: She is alert and oriented to person, place, and time. She has normal reflexes. No cranial nerve deficit. Gait normal. Coordination normal.  Skin: Skin is warm and dry. No rash noted.  Psychiatric: Mood, memory, affect and judgment normal.   Nursing note and vitals reviewed.  LABORATORY DATA:  I have reviewed the data as listed CMP     Component Value Date/Time   NA 141 11/10/2011 1113   K 3.8 11/10/2011 1113   CL 103 11/10/2011 1113   CO2 27 11/10/2011 1113   GLUCOSE 105* 11/10/2011 1113   BUN 17 11/10/2011 1113   CREATININE 0.80 11/10/2011 1113   CALCIUM 9.4 11/10/2011 1113   PROT 7.4 11/10/2011 1113   ALBUMIN 3.8 11/10/2011 1113   AST 16 11/10/2011 1113   ALT 12 11/10/2011 1113   ALKPHOS 79 11/10/2011 1113   BILITOT 0.3 11/10/2011 1113   GFRNONAA 80* 11/10/2011 1113   GFRAA >90 11/10/2011 1113   RADIOGRAPHIC STUDIES: I have personally reviewed the radiological images as listed and agreed with the findings in the report. CLINICAL DATA:  Wheezing at night for 1 month, with some associated shortness of breath on exertion, and cough.  EXAM: CHEST 2 VIEW  COMPARISON: 03/12/2013  FINDINGS: The heart size and mediastinal contours are within normal limits. Both lungs are clear. There is a rounded gas containing density overlying the mid lower thorax, which likely represents a hiatal hernia. The visualized skeletal structures are unremarkable.  IMPRESSION: No radiographic evidence of active cardiopulmonary disease.  Probable hiatal hernia. Endoscopic evaluation or evaluation with upper GI fluoroscopy may be considered.   Electronically Signed  By: Fidela Salisbury M.D.  On: 10/18/2014 16:14  ASSESSMENT & PLAN:  Thrombocytosis Iron deficiency with serum ferritin 20 ng/ml, iron saturation at 9% Mild elevation of IGA   I discussed causes of thrombocytosis with the patient. The initial diagnostic question is whether thrombocytosis is a reactive phenomenon or a marker for the presence of a hematologic disorder She has evidence of iron deficiency. She seems to be tolerating oral iron ok. Studies for MPD are negative and I suspect her mild thrombocytosis may be secondary to her low iron.    She is to stay on her oral iron. I will see her back in 6 weeks with repeat labs.  Given her elevated IgA I have recommended an SPEP/IEP at f/u as well. Labs ordered at f/u are detailed below:  Orders Placed This Encounter  Procedures  . Protein electrophoresis, serum    Standing Status: Future     Number of Occurrences: 1     Standing Expiration Date: 07/08/2016  . Immunofixation electrophoresis    Standing Status: Future     Number of Occurrences: 1     Standing Expiration Date: 07/08/2016  . CBC with Differential    Standing Status: Future     Number of Occurrences:      Standing Expiration Date: 07/08/2016  . Ferritin    Standing Status: Future     Number of Occurrences:      Standing Expiration Date: 07/08/2016    All questions were answered. The patient knows to call the clinic with any problems, questions or concerns.  This document serves as a record of services personally performed by Ancil Linsey, MD. It was created on her behalf by Arlyce Harman, a trained medical scribe. The creation of this record is based on the scribe's personal observations and the provider's statements to them. This document has been checked and approved by the attending provider.  I have reviewed the above documentation for accuracy and completeness, and I agree with the above.  This note was electronically signed.    Molli Hazard, MD  07/10/2015 7:58 AM

## 2015-07-20 DIAGNOSIS — E611 Iron deficiency: Secondary | ICD-10-CM | POA: Insufficient documentation

## 2015-08-22 ENCOUNTER — Other Ambulatory Visit (HOSPITAL_COMMUNITY): Payer: Managed Care, Other (non HMO)

## 2015-08-22 ENCOUNTER — Ambulatory Visit (HOSPITAL_COMMUNITY): Payer: Managed Care, Other (non HMO) | Admitting: Hematology & Oncology

## 2015-09-08 ENCOUNTER — Encounter: Payer: Self-pay | Admitting: Hematology & Oncology

## 2015-09-17 ENCOUNTER — Ambulatory Visit (HOSPITAL_COMMUNITY): Payer: Managed Care, Other (non HMO) | Admitting: Hematology & Oncology

## 2015-09-19 ENCOUNTER — Encounter (HOSPITAL_COMMUNITY): Payer: Managed Care, Other (non HMO) | Attending: Hematology & Oncology | Admitting: Hematology & Oncology

## 2015-09-19 VITALS — BP 138/73 | HR 95 | Resp 18 | Wt 170.0 lb

## 2015-09-19 DIAGNOSIS — D75839 Thrombocytosis, unspecified: Secondary | ICD-10-CM

## 2015-09-19 DIAGNOSIS — I10 Essential (primary) hypertension: Secondary | ICD-10-CM | POA: Insufficient documentation

## 2015-09-19 DIAGNOSIS — K219 Gastro-esophageal reflux disease without esophagitis: Secondary | ICD-10-CM | POA: Insufficient documentation

## 2015-09-19 DIAGNOSIS — M1990 Unspecified osteoarthritis, unspecified site: Secondary | ICD-10-CM | POA: Insufficient documentation

## 2015-09-19 DIAGNOSIS — E611 Iron deficiency: Secondary | ICD-10-CM

## 2015-09-19 DIAGNOSIS — D473 Essential (hemorrhagic) thrombocythemia: Secondary | ICD-10-CM

## 2015-09-19 DIAGNOSIS — Z79899 Other long term (current) drug therapy: Secondary | ICD-10-CM | POA: Insufficient documentation

## 2015-09-19 NOTE — Progress Notes (Signed)
[facility] at [place] NOTE  Patient Care Team: Sharilyn Sites, MD as PCP - General (Family Medicine)  CHIEF COMPLAINTS/PURPOSE OF CONSULTATION:  Thrombocytosis Iron deficiency with serum ferritin 20 ng/ml, iron saturation at 9% Mild elevation of IGA  HISTORY OF PRESENTING ILLNESS:  Kelly Watkins 60 y.o. female is here for follow-up of thrombocytosis and iron deficiency.   Kelly Watkins returns to the [facility] today unaccompanied.  She notes that she is tolerating the oral iron fine without problems. She feels comfortable continuing with that for the next few months until her iron is replenished, and notes that she takes it every day.  Other than this, she notes that sometimes she's tired and wears out easy, specifically citing yard work. She denies anything severe, or anything new or different.  Her appetite is good, she denies nausea or worse constipation.  Her last colonoscopy was January of 2016, and she had it done here with Dr. Laural Golden.  In terms of questions, she notes "I do take Prilosec." The first time that doctor Rehman did the endo on her, she notes "he found 59 or so little polyps"  She wonders if this is due to the Prilosec and if it could be affecting her iron. She notes that she tried to start taking Zantac and it didn't work, even at three times a day.  She had her bloodwork done at [facility] because she had switched insurances and this was preferable cost-wise to getting her labs here at [facility].  MEDICAL HISTORY:  Past Medical History  Diagnosis Date  . GERD (gastroesophageal reflux disease)   . Hypertension   . Dysphagia     SURGICAL HISTORY: Past Surgical History  Procedure Laterality Date  . Abdominal hysterectomy    . Tubal ligation    . Eye surgery    . Colonoscopy    . Esophagogastroduodenoscopy (egd) with esophageal dilation      X 2  . Colonoscopy N/A 06/07/2014    Procedure: COLONOSCOPY;  Surgeon: Rogene Houston,  MD;  [facility];  Service: Endoscopy;  Laterality: N/A;  1250  . Esophagogastroduodenoscopy N/A 06/07/2014    Procedure: ESOPHAGOGASTRODUODENOSCOPY (EGD);  Surgeon: Rogene Houston, MD;  [facility];  Service: Endoscopy;  Laterality: N/A;  [name] dilation N/A 06/07/2014    Procedure: [name] DILATION;  Surgeon: Rogene Houston, MD;  [facility];  Service: Endoscopy;  Laterality: N/A;  . Mandible surgery  1987    SOCIAL HISTORY: Social History   Social History  . Marital Status: Married    Spouse Name: N/A  . Number of Children: N/A  . Years of Education: N/A   Occupational History  . Not on file.   Social History Main Topics  . Smoking status: Never Smoker   . Smokeless tobacco: Not on file  . Alcohol Use: No  . Drug Use: No  . Sexual Activity: Not on file   Other Topics Concern  . Not on file   Social History Narrative  Married 1 child 1 grandchild Works at [employer] in [place] smoker. ETOH, none No hobbies currently. Spends time with her granddaughter. She is originally from [place]: Family History  Problem Relation Age of Onset  . Hypertension Mother    indicated that her mother is alive. She indicated that her father is alive. She indicated that her sister is alive.   Mother was very petite, in her  last 20 years she was sick with dementia, heart problems, and a spot on her lung. She passed away this past year at 62 yo. Was a smoker Father is still living. He will be 82 in April. In good health. 2 sisters, fairly healthy  ALLERGIES:  is allergic to hydrochlorothiazide.  MEDICATIONS:  Current Outpatient Prescriptions  Medication Sig Dispense Refill  . CINNAMON PO Take 1 capsule by mouth daily. Reported on 09/19/2015    . Estradiol 10 MCG TABS vaginal tablet Place vaginally 2 (two) times a week.    . iron polysaccharides (IFEREX 150) 150 MG capsule     . KRILL OIL PO Take 2 capsules by mouth daily.      Marland Kitchen losartan (COZAAR) 100 MG tablet   0  . omeprazole (PRILOSEC) 20 MG capsule take 1 capsule by mouth once daily 30 capsule 11  . phentermine (ADIPEX-P) 37.5 MG tablet     . furosemide (LASIX) 20 MG tablet Take 20 mg by mouth as needed. Reported on 09/19/2015    . Iron Polysacch Cmplx-B12-FA 150-0.025-1 MG CAPS Take 150 mg by mouth daily. (Patient not taking: Reported on 09/19/2015) 30 each 3  . metoprolol (LOPRESSOR) 50 MG tablet Take 1 tablet (50 mg total) by mouth 2 (two) times daily. (Patient not taking: Reported on 09/19/2015) 30 tablet 0   No current facility-administered medications for this visit.    Review of Systems  Constitutional: Negative.   HENT: Negative.   Eyes: Negative.   Respiratory: Negative.   Cardiovascular: Negative.   Gastrointestinal: Negative.   Genitourinary: Negative.   Musculoskeletal: Positive for joint pain.       Right hip OA  Skin: Negative.   Neurological: Negative.   Endo/Heme/Allergies: Negative.   Psychiatric/Behavioral: Negative.   All other systems reviewed and are negative.  14 point ROS was done and is otherwise as detailed above or in HPI    PHYSICAL EXAMINATION: ECOG PERFORMANCE STATUS: 0 - Asymptomatic  Filed Vitals:   09/19/15 1400  BP: 138/73  Pulse: 95  Resp: 18   Filed Weights   09/19/15 1400  Weight: 170 lb (77.111 kg)   Physical Exam  Constitutional: She is oriented to person, place, and time and well-developed, well-nourished, and in no distress.  HENT:  Head: Normocephalic and atraumatic.  Nose: Nose normal.  Mouth/Throat: Oropharynx is clear and moist. No oropharyngeal exudate.  Eyes: Conjunctivae and EOM are normal. Pupils are equal, round, and reactive to light. Right eye exhibits no discharge. Left eye exhibits no discharge. No scleral icterus.  Neck: Normal range of motion. Neck supple. No tracheal deviation present. No thyromegaly present.  Cardiovascular: Normal rate, regular rhythm and normal heart sounds.   Exam reveals no gallop and no friction rub.   No murmur heard. Pulmonary/Chest: Effort normal and breath sounds normal. She has no wheezes. She has no rales.  Abdominal: Soft. Bowel sounds are normal. She exhibits no distension and no mass. There is no tenderness. There is no rebound and no guarding.  Musculoskeletal: Normal range of motion. She exhibits no edema.  Lymphadenopathy:    She has no cervical adenopathy.  Neurological: She is alert and oriented to person, place, and time. She has normal reflexes. No cranial nerve deficit. Gait normal. Coordination normal.  Skin: Skin is warm and dry. No rash noted.  Psychiatric: Mood, memory, affect and judgment normal.  Nursing note and vitals reviewed.  LABORATORY DATA:  I have reviewed the data as listed CMP  Component Value Date/Time   NA 141 11/10/2011 1113   K 3.8 11/10/2011 1113   CL 103 11/10/2011 1113   CO2 27 11/10/2011 1113   GLUCOSE 105* 11/10/2011 1113   BUN 17 11/10/2011 1113   CREATININE 0.80 11/10/2011 1113   CALCIUM 9.4 11/10/2011 1113   PROT 7.4 11/10/2011 1113   ALBUMIN 3.8 11/10/2011 1113   AST 16 11/10/2011 1113   ALT 12 11/10/2011 1113   ALKPHOS 79 11/10/2011 1113   BILITOT 0.3 11/10/2011 1113   GFRNONAA 80* 11/10/2011 1113   GFRAA >90 11/10/2011 1113    JAK 2 EXON 12, MPL, CALR mutation analyses NEGATIVE  Results for ORIONNA, BINKERD (MRN [id number]) as of 09/26/2015 17:13  Ref. Range 06/25/2015 15:30  Sed Rate Latest Ref Range: 0-22 mm/hr 40 (H)  TSH Latest Ref Range: 0.350-4.500 uIU/mL 1.995  ANA Ab, IFA Unknown Negative   Results for SANISHA, CLANTON (MRN [id number]) as of 09/26/2015 17:13  Ref. Range 06/25/2015 15:30  CRP Latest Ref Range: <1.0 mg/dL <0.5      RADIOGRAPHIC STUDIES: I have personally reviewed the radiological images as listed and agreed with the findings in the report. CLINICAL DATA: Wheezing at night for 1 month, with some associated shortness of breath on exertion, and  cough.  EXAM: CHEST 2 VIEW  COMPARISON: 03/12/2013  FINDINGS: The heart size and mediastinal contours are within normal limits. Both lungs are clear. There is a rounded gas containing density overlying the mid lower thorax, which likely represents a hiatal hernia. The visualized skeletal structures are unremarkable.  IMPRESSION: No radiographic evidence of active cardiopulmonary disease.  Probable hiatal hernia. Endoscopic evaluation or evaluation with upper GI fluoroscopy may be considered.   Electronically Signed  By: Fidela Salisbury M.D.  On: 10/18/2014 16:14  ASSESSMENT & PLAN:  Thrombocytosis Iron deficiency with serum ferritin 20 ng/ml, iron saturation at 9% Mild elevation of IGA, normal SPEP, IEP  She is to stay on her oral iron. She is tolerating it well and responding well. I suspect her thrombocytosis may be secondary to her iron deficiency, workup for MPD is negative. We will continue to monitor moving forward.   We will recheck labs in 8 weeks. She wishes to have these done at [place]. I will see her back in 4 months.   All questions were answered. The patient knows to call the clinic with any problems, questions or concerns.  This document serves as a record of services personally performed by Ancil Linsey, MD. It was created on her behalf by Toni Amend, a trained medical scribe. The creation of this record is based on the scribe's personal observations and the provider's statements to them. This document has been checked and approved by the attending provider.  I have reviewed the above documentation for accuracy and completeness, and I agree with the above.  This note was electronically signed.    Molli Hazard, MD  09/19/2015 2:20 PM

## 2015-09-19 NOTE — Patient Instructions (Signed)
[place] at [facility] Discharge Instructions  RECOMMENDATIONS MADE BY THE CONSULTANT AND ANY TEST RESULTS WILL BE SENT TO YOUR REFERRING PHYSICIAN.  Exam done and seen today by Dr. Whitney Muse Labs to be done at [facility] in 8 weeks Gave you script for your blood work Return to see the doctor in 4 months Please call the clinic if you have any questions or concerns   Thank you for choosing [place] at [facility] to provide your oncology and hematology care.  To afford each patient quality time with our provider, please arrive at least 15 minutes before your scheduled appointment time.   Beginning January 23rd 2017 lab work for the [facility] will be done in the  Main lab at [facility] on 1st floor. If you have a lab appointment with the [facility] please come in thru the  Main Entrance and check in at the main information desk  You need to re-schedule your appointment should you arrive 10 or more minutes late.  We strive to give you quality time with our providers, and arriving late affects you and other patients whose appointments are after yours.  Also, if you no show three or more times for appointments you may be dismissed from the clinic at the providers discretion.     Again, thank you for choosing [facility].  Our hope is that these requests will decrease the amount of time that you wait before being seen by our physicians.       _____________________________________________________________  Should you have questions after your visit to [facility], please contact our office at [phone] between the hours of 8:30 a.m. and 4:30 p.m.  Voicemails left after 4:30 p.m. will not be returned until the following business day.  For prescription refill requests, have your pharmacy contact our office.         Resources For Cancer Patients and their Caregivers ? American Cancer Society: Can assist with  transportation, wigs, general needs, runs Look Good Feel Better.        860-423-1051 ? Cancer Care: Provides financial assistance, online support groups, medication/co-pay assistance.  1-800-813-HOPE 5811101257) ? [place] Assists [place] cancer patients and their families through emotional , educational and financial support.  ([phone] 751-4378 ? [place] DSS Where to apply for food stamps, Medicaid and utility assistance. 772-032-6489 ? RCATS: Transportation to medical appointments. 636-701-7520 ? Social Security Administration: May apply for disability if have a Stage IV cancer. 973-545-5080 205-704-1271 ? [employer], Disability and Transit Services: Assists with nutrition, care and transit needs. [phone]

## 2015-09-23 NOTE — Progress Notes (Signed)
This encounter was created in error - please disregard.

## 2015-09-26 ENCOUNTER — Encounter (HOSPITAL_COMMUNITY): Payer: Self-pay | Admitting: Hematology & Oncology

## 2015-11-02 ENCOUNTER — Other Ambulatory Visit (HOSPITAL_COMMUNITY): Payer: Self-pay | Admitting: Hematology & Oncology

## 2015-12-15 ENCOUNTER — Encounter: Payer: Self-pay | Admitting: Hematology & Oncology

## 2016-01-23 ENCOUNTER — Encounter (HOSPITAL_COMMUNITY): Payer: Self-pay | Admitting: Hematology & Oncology

## 2016-01-23 ENCOUNTER — Encounter (HOSPITAL_COMMUNITY): Payer: Managed Care, Other (non HMO) | Attending: Hematology & Oncology | Admitting: Hematology & Oncology

## 2016-01-23 ENCOUNTER — Encounter: Payer: Self-pay | Admitting: Hematology & Oncology

## 2016-01-23 VITALS — BP 135/71 | HR 71 | Temp 98.5°F | Resp 16 | Ht 64.0 in | Wt 174.0 lb

## 2016-01-23 DIAGNOSIS — D473 Essential (hemorrhagic) thrombocythemia: Secondary | ICD-10-CM | POA: Diagnosis not present

## 2016-01-23 DIAGNOSIS — E611 Iron deficiency: Secondary | ICD-10-CM | POA: Diagnosis not present

## 2016-01-23 DIAGNOSIS — D509 Iron deficiency anemia, unspecified: Secondary | ICD-10-CM | POA: Diagnosis not present

## 2016-01-23 DIAGNOSIS — D75839 Thrombocytosis, unspecified: Secondary | ICD-10-CM

## 2016-01-23 NOTE — Progress Notes (Signed)
[name] at [facility] NOTE  Patient Care Team: Sharilyn Sites, MD as PCP - General (Family Medicine)  CHIEF COMPLAINTS/PURPOSE OF CONSULTATION:  Thrombocytosis Iron deficiency with serum ferritin 20 ng/ml, iron saturation at 9% Mild elevation of IGA  HISTORY OF PRESENTING ILLNESS:  Kelly Watkins 60 y.o. female is here for follow-up of thrombocytosis and iron deficiency.   Kelly Watkins returns to the [facility] today unaccompanied.  She is doing well. She has been feeling better since beginning taking iron, "I don't feel as tired". She denies any issues with her medications.   She is on [employer]. She denies blood in stool. Last colonoscopy performed on 06/07/2014. She notes that she takes it daily.    MEDICAL HISTORY:  Past Medical History:  Diagnosis Date  . Dysphagia   . GERD (gastroesophageal reflux disease)   . Hypertension     SURGICAL HISTORY: Past Surgical History:  Procedure Laterality Date  . ABDOMINAL HYSTERECTOMY    . COLONOSCOPY    . COLONOSCOPY N/A 06/07/2014   Procedure: COLONOSCOPY;  Surgeon: Rogene Houston, MD;  [facility];  Service: Endoscopy;  Laterality: N/A;  1250  . ESOPHAGOGASTRODUODENOSCOPY N/A 06/07/2014   Procedure: ESOPHAGOGASTRODUODENOSCOPY (EGD);  Surgeon: Rogene Houston, MD;  [facility];  Service: Endoscopy;  Laterality: N/A;  . ESOPHAGOGASTRODUODENOSCOPY (EGD) WITH ESOPHAGEAL DILATION     X 2  . EYE SURGERY    . MALONEY DILATION N/A 06/07/2014   Procedure: [name] DILATION;  Surgeon: Rogene Houston, MD;  [facility];  Service: Endoscopy;  Laterality: N/A;  . [place]  . TUBAL LIGATION      SOCIAL HISTORY: Social History   Social History  . Marital status: Married    Spouse name: N/A  . Number of children: N/A  . Years of education: N/A   Occupational History  . Not on file.   Social History Main Topics  . Smoking status: Never Smoker  .  Smokeless tobacco: Never Used  . Alcohol use No  . Drug use: No  . Sexual activity: Not on file   Other Topics Concern  . Not on file   Social History Narrative  . No narrative on file  Married 1 child 1 grandchild Works at [employer] in [place] smoker. ETOH, none No hobbies currently. Spends time with her granddaughter. She is originally from [place]: Family History  Problem Relation Age of Onset  . Hypertension Mother    indicated that her mother is alive. She indicated that her father is alive. She indicated that her sister is alive.    Mother was very petite, in her last 32 years she was sick with dementia, heart problems, and a spot on her lung. She passed away this past year at 62 yo. Was a smoker Father is still living. He will be 82 in April. In good health. 2 sisters, fairly healthy  ALLERGIES:  is allergic to hydrochlorothiazide.  MEDICATIONS:  Current Outpatient Prescriptions  Medication Sig Dispense Refill  . CINNAMON PO Take 1 capsule by mouth daily. Reported on 09/19/2015    . clobetasol ointment (TEMOVATE) 0.05 % APPLY TO AFFECTED AREA TWICE DAILY  0  . Estradiol 10 MCG TABS vaginal tablet Place vaginally 2 (two) times a week.    Marland Kitchen IFEREX 150 150 MG capsule take 1 capsule by mouth once daily 30 capsule 5  . KRILL OIL PO Take  2 capsules by mouth daily.     Marland Kitchen losartan (COZAAR) 100 MG tablet   0  . omeprazole (PRILOSEC) 20 MG capsule take 1 capsule by mouth once daily 30 capsule 11  . furosemide (LASIX) 20 MG tablet Take 20 mg by mouth as needed. Reported on 09/19/2015    . indomethacin (INDOCIN) 50 MG capsule take 1 capsule by mouth three times a day if needed  0   No current facility-administered medications for this visit.     Review of Systems  Constitutional: Negative.   HENT: Negative.   Eyes: Negative.   Respiratory: Negative.   Cardiovascular: Negative.   Gastrointestinal: Negative.   Genitourinary: Negative.     Musculoskeletal: Positive for joint pain.       Right hip OA  Skin: Negative.   Neurological: Negative.   Endo/Heme/Allergies: Negative.   Psychiatric/Behavioral: Negative.   All other systems reviewed and are negative. 14 point ROS was done and is otherwise as detailed above or in HPI    PHYSICAL EXAMINATION: ECOG PERFORMANCE STATUS: 0 - Asymptomatic  Vitals:   01/23/16 1411  BP: 135/71  Pulse: 71  Resp: 16  Temp: 98.5 F (36.9 C)   Filed Weights   01/23/16 1411  Weight: 174 lb (78.9 kg)   Physical Exam  Constitutional: She is oriented to person, place, and time and well-developed, well-nourished, and in no distress.  HENT:  Head: Normocephalic and atraumatic.  Nose: Nose normal.  Mouth/Throat: Oropharynx is clear and moist. No oropharyngeal exudate.  Eyes: Conjunctivae and EOM are normal. Pupils are equal, round, and reactive to light. Right eye exhibits no discharge. Left eye exhibits no discharge. No scleral icterus.  Neck: Normal range of motion. Neck supple. No tracheal deviation present. No thyromegaly present.  Cardiovascular: Normal rate, regular rhythm and normal heart sounds.  Exam reveals no gallop and no friction rub.   No murmur heard. Pulmonary/Chest: Effort normal and breath sounds normal. She has no wheezes. She has no rales.  Abdominal: Soft. Bowel sounds are normal. She exhibits no distension and no mass. There is no tenderness. There is no rebound and no guarding.  Musculoskeletal: Normal range of motion. She exhibits no edema.  Lymphadenopathy:    She has no cervical adenopathy.  Neurological: She is alert and oriented to person, place, and time. She has normal reflexes. No cranial nerve deficit. Gait normal. Coordination normal.  Skin: Skin is warm and dry. No rash noted.  Psychiatric: Mood, memory, affect and judgment normal.  Nursing note and vitals reviewed.  LABORATORY DATA:  I have reviewed the data as listed CMP     Component Value  Date/Time   NA 141 11/10/2011 1113   K 3.8 11/10/2011 1113   CL 103 11/10/2011 1113   CO2 27 11/10/2011 1113   GLUCOSE 105 (H) 11/10/2011 1113   BUN 17 11/10/2011 1113   CREATININE 0.80 11/10/2011 1113   CALCIUM 9.4 11/10/2011 1113   PROT 7.4 11/10/2011 1113   ALBUMIN 3.8 11/10/2011 1113   AST 16 11/10/2011 1113   ALT 12 11/10/2011 1113   ALKPHOS 79 11/10/2011 1113   BILITOT 0.3 11/10/2011 1113   GFRNONAA 80 (L) 11/10/2011 1113   GFRAA >90 11/10/2011 1113    JAK 2 EXON 12, MPL, CALR mutation analyses NEGATIVE  Results for ALFREDIA, PETTI (MRN [id number]) as of 09/26/2015 17:13  Ref. Range 06/25/2015 15:30  Sed Rate Latest Ref Range: 0-22 mm/hr 40 (H)  TSH Latest Ref Range: 0.350-4.500 uIU/mL  1.995  ANA Ab, IFA Unknown Negative   Results for DAYJA, QUARTERMAN (MRN [id number]) as of 09/26/2015 17:13  Ref. Range 06/25/2015 15:30  CRP Latest Ref Range: <1.0 mg/dL <0.5        ASSESSMENT & PLAN:  Thrombocytosis Iron deficiency with serum ferritin 20 ng/ml, iron saturation at 9% Mild elevation of IGA, normal SPEP, IEP  I suspect her thrombocytosis may be secondary to her iron deficiency, workup for MPD is negative. We will continue to monitor moving forward.   After several months on oral iron her ferritin levels were not markedly improved.   She is on [employer]. Last colonoscopy performed on 06/07/2014.  Labs will be drawn after our visit today. She has these done at [place].  If ferritin is not improved, we will set up an appointment for IV iron.  RTC 6 months. Lab scheduled will be determined based upon lab results.  All questions were answered. The patient knows to call the clinic with any problems, questions or concerns.  This document serves as a record of services personally performed by Ancil Linsey, MD. It was created on her behalf by Arlyce Harman, a trained medical scribe. The creation of this record is based on the scribe's personal observations and the  provider's statements to them. This document has been checked and approved by the attending provider.  I have reviewed the above documentation for accuracy and completeness, and I agree with the above.  This note was electronically signed.    Molli Hazard, MD  01/23/2016 2:16 PM

## 2016-01-23 NOTE — Patient Instructions (Signed)
[place] at [facility] Discharge Instructions  RECOMMENDATIONS MADE BY THE CONSULTANT AND ANY TEST RESULTS WILL BE SENT TO YOUR REFERRING PHYSICIAN.  You saw Dr. Whitney Muse today. Labs at [place]. Follow up in 6 months. May be seen sooner, depends on lab results.  Thank you for choosing [place] at [facility] to provide your oncology and hematology care.  To afford each patient quality time with our provider, please arrive at least 15 minutes before your scheduled appointment time.   Beginning January 23rd 2017 lab work for the [facility] will be done in the  Main lab at [facility] on 1st floor. If you have a lab appointment with the [facility] please come in thru the  Main Entrance and check in at the main information desk  You need to re-schedule your appointment should you arrive 10 or more minutes late.  We strive to give you quality time with our providers, and arriving late affects you and other patients whose appointments are after yours.  Also, if you no show three or more times for appointments you may be dismissed from the clinic at the providers discretion.     Again, thank you for choosing [facility].  Our hope is that these requests will decrease the amount of time that you wait before being seen by our physicians.       _____________________________________________________________  Should you have questions after your visit to [facility], please contact our office at [phone] between the hours of 8:30 a.m. and 4:30 p.m.  Voicemails left after 4:30 p.m. will not be returned until the following business day.  For prescription refill requests, have your pharmacy contact our office.         Resources For Cancer Patients and their Caregivers ? American Cancer Society: Can assist with transportation, wigs, general needs, runs Look Good Feel Better.        8308528846 ? Cancer  Care: Provides financial assistance, online support groups, medication/co-pay assistance.  1-800-813-HOPE 620-424-8417) ? [place] Assists [place] cancer patients and their families through emotional , educational and financial support.  519-090-8299 ? [place] DSS Where to apply for food stamps, Medicaid and utility assistance. 619-672-8389 ? RCATS: Transportation to medical appointments. 949-520-3174 ? Social Security Administration: May apply for disability if have a Stage IV cancer. 708-735-9107 6691255687 ? [employer], Disability and Transit Services: Assists with nutrition, care and transit needs. [place] Support Programs: @10RELATIVEDAYS @ > Cancer Support Group  2nd Tuesday of the month 1pm-2pm, Journey Room  > Creative Journey  3rd Tuesday of the month 1130am-1pm, Journey Room  > Look Good Feel Better  1st Wednesday of the month 10am-12 noon, Journey Room (Call [place] to register [phone])

## 2016-02-01 ENCOUNTER — Encounter (HOSPITAL_COMMUNITY): Payer: Self-pay | Admitting: Hematology & Oncology

## 2016-02-04 DIAGNOSIS — L900 Lichen sclerosus et atrophicus: Secondary | ICD-10-CM | POA: Diagnosis not present

## 2016-05-19 ENCOUNTER — Telehealth (HOSPITAL_COMMUNITY): Payer: Self-pay | Admitting: *Deleted

## 2016-05-20 ENCOUNTER — Telehealth (HOSPITAL_COMMUNITY): Payer: Self-pay

## 2016-05-20 ENCOUNTER — Telehealth (HOSPITAL_COMMUNITY): Payer: Self-pay | Admitting: *Deleted

## 2016-05-20 NOTE — Telephone Encounter (Signed)
Patient had concern about constipation with her iron supplement that she is taking.  She states that she picked up some colace with a mild laxative in it and was instructed that it was fine for her to take that and for her to call with any further complications.  She verbalized understanding.

## 2016-05-22 ENCOUNTER — Other Ambulatory Visit (HOSPITAL_COMMUNITY): Payer: Self-pay | Admitting: Oncology

## 2016-06-04 DIAGNOSIS — D473 Essential (hemorrhagic) thrombocythemia: Secondary | ICD-10-CM | POA: Diagnosis not present

## 2016-06-04 DIAGNOSIS — Z1389 Encounter for screening for other disorder: Secondary | ICD-10-CM | POA: Diagnosis not present

## 2016-06-04 DIAGNOSIS — I10 Essential (primary) hypertension: Secondary | ICD-10-CM | POA: Diagnosis not present

## 2016-06-04 DIAGNOSIS — Z6831 Body mass index (BMI) 31.0-31.9, adult: Secondary | ICD-10-CM | POA: Diagnosis not present

## 2016-06-04 DIAGNOSIS — R7309 Other abnormal glucose: Secondary | ICD-10-CM | POA: Diagnosis not present

## 2016-06-04 DIAGNOSIS — D509 Iron deficiency anemia, unspecified: Secondary | ICD-10-CM | POA: Diagnosis not present

## 2016-06-04 DIAGNOSIS — E6609 Other obesity due to excess calories: Secondary | ICD-10-CM | POA: Diagnosis not present

## 2016-06-10 DIAGNOSIS — Z6830 Body mass index (BMI) 30.0-30.9, adult: Secondary | ICD-10-CM | POA: Diagnosis not present

## 2016-06-10 DIAGNOSIS — E669 Obesity, unspecified: Secondary | ICD-10-CM | POA: Diagnosis not present

## 2016-06-16 ENCOUNTER — Ambulatory Visit (INDEPENDENT_AMBULATORY_CARE_PROVIDER_SITE_OTHER): Payer: BLUE CROSS/BLUE SHIELD | Admitting: Internal Medicine

## 2016-06-16 ENCOUNTER — Encounter (INDEPENDENT_AMBULATORY_CARE_PROVIDER_SITE_OTHER): Payer: Self-pay | Admitting: Internal Medicine

## 2016-06-16 VITALS — BP 136/80 | HR 72 | Temp 97.8°F | Ht 64.0 in | Wt 180.0 lb

## 2016-06-16 DIAGNOSIS — D508 Other iron deficiency anemias: Secondary | ICD-10-CM

## 2016-06-16 NOTE — Progress Notes (Signed)
Subjective:    Patient ID: Kelly Watkins, female    DOB: 28-Jun-1955, 61 y.o.   MRN: [id number]  HPI Here today for f/u. She is followed at the [facility] in [place] for Thrombocytosis, IDA. Appears she was last seen by DR. Penland 01/23/2016.  She says she found out th at her platelets were high. She also has low iron. She was started on Iron.  No constipation associated with her symptoms. She tells me her mother had the same problems and had lesions in her esophagus and Dr. Britta Mccreedy cauterized them.  Three weeks ago she became constipated, and had rectal itching. She took Colace with a Laxative and she became better. She still has some itching around her rectum.  Her appetite is good. She is trying to diet Usually has a BM every other day.  05/25/2016 H and H 13.0 and 38.5, MCV 81 Ferritin 29, TIBC 375, UIBC 325, Iron 50, %Sat 13%,  Vitamin B12 328, Folate 16.5,  Platelet ct 376   06/07/2014 EGD with gastric polypectomy, ED and Colonoscopy:   Indications:  Patient is 61 year old Caucasian female with chronic GERD who presents with intermittent solid food dysphagia. Her esophagus was last dilated in July 2005. She also has history of hyperplastic gastric polyps. Patient is also undergoing average risk screening colonoscopy.                     Impression:  EGD findings; Nonobstructive Schatzki's ring dilated by passing 56 French Maloney dilator but ring had to be disrupted with focal biopsy. Small sliding hiatal hernia. Multiple gastric polyps ranging in size from 4-15 mm. Three of these polyps were hot snared and retrieved for histologic examination.  Colonoscopy findings; Small external hemorrhoids otherwise normal colonoscopy..  CBC Latest Ref Rng & Units 06/25/2015 11/10/2011  WBC 4.0 - 10.5 K/uL 8.7 7.5  Hemoglobin 12.0 - 15.0 g/dL 13.2 13.3  Hematocrit 36.0 - 46.0 % 39.7 40.2  Platelets 150 - 400 K/uL 389 347                                Review of Systems Past Medical History:    Diagnosis Date  . Dysphagia   . GERD (gastroesophageal reflux disease)   . Hypertension     Past Surgical History:  Procedure Laterality Date  . ABDOMINAL HYSTERECTOMY    . COLONOSCOPY    . COLONOSCOPY N/A 06/07/2014   Procedure: COLONOSCOPY;  Surgeon: Rogene Houston, MD;  [facility];  Service: Endoscopy;  Laterality: N/A;  1250  . ESOPHAGOGASTRODUODENOSCOPY N/A 06/07/2014   Procedure: ESOPHAGOGASTRODUODENOSCOPY (EGD);  Surgeon: Rogene Houston, MD;  [facility];  Service: Endoscopy;  Laterality: N/A;  . ESOPHAGOGASTRODUODENOSCOPY (EGD) WITH ESOPHAGEAL DILATION     X 2  . EYE SURGERY    . MALONEY DILATION N/A 06/07/2014   Procedure: [name] DILATION;  Surgeon: Rogene Houston, MD;  [facility];  Service: Endoscopy;  Laterality: N/A;  . [place]  . TUBAL LIGATION      Allergies  Allergen Reactions  . Hydrochlorothiazide Itching    Current Outpatient Prescriptions on File Prior to Visit  Medication Sig Dispense Refill  . CINNAMON PO Take 1 capsule by mouth daily. Reported on 09/19/2015    . clobetasol ointment (TEMOVATE) 0.05 % APPLY TO AFFECTED AREA TWICE DAILY  0  . Estradiol 10 MCG TABS vaginal  tablet Place vaginally 2 (two) times a week.    . furosemide (LASIX) 20 MG tablet Take 20 mg by mouth as needed. Reported on 09/19/2015    . IFEREX 150 150 MG capsule take 1 capsule by mouth once daily 30 capsule 5  . indomethacin (INDOCIN) 50 MG capsule take 1 capsule by mouth three times a day if needed  0  . KRILL OIL PO Take 2 capsules by mouth daily.     Marland Kitchen losartan (COZAAR) 100 MG tablet daily.   0  . omeprazole (PRILOSEC) 20 MG capsule take 1 capsule by mouth once daily 30 capsule 11   No current facility-administered medications on file prior to visit.        Objective:   Physical Exam Blood pressure 136/80, pulse 72, temperature 97.8 F (36.6 C), height 5\' 4"  (1.626 m), weight 180 lb (81.6 kg).   Alert and oriented. Skin  warm and dry. Oral mucosa is moist.   . Sclera anicteric, conjunctivae is pink. Thyroid not enlarged. No cervical lymphadenopathy. Lungs clear. Heart regular rate and rhythm.  Abdomen is soft. Bowel sounds are positive. No hepatomegaly. No abdominal masses felt. No tenderness.  No edema to lower extremities.  Stool brown and guaiac negative.        Assessment & Plan:  IDA. Am going to send 3 stool cards home with her. If one is positive, Given's capsule.  I discussed with Dr. Laural Golden.

## 2016-06-16 NOTE — Patient Instructions (Signed)
3 stool cards home with patient 

## 2016-06-17 ENCOUNTER — Telehealth (INDEPENDENT_AMBULATORY_CARE_PROVIDER_SITE_OTHER): Payer: Self-pay | Admitting: Internal Medicine

## 2016-06-17 ENCOUNTER — Other Ambulatory Visit (INDEPENDENT_AMBULATORY_CARE_PROVIDER_SITE_OTHER): Payer: Self-pay | Admitting: Internal Medicine

## 2016-06-17 DIAGNOSIS — D508 Other iron deficiency anemias: Secondary | ICD-10-CM

## 2016-06-17 NOTE — Telephone Encounter (Signed)
Ann, Given's capsule. I left a message on her phone.

## 2016-06-17 NOTE — Telephone Encounter (Signed)
Message left on phone that Dr. Laural Golden wants to do a Given Capsule.

## 2016-06-22 ENCOUNTER — Telehealth (INDEPENDENT_AMBULATORY_CARE_PROVIDER_SITE_OTHER): Payer: Self-pay | Admitting: *Deleted

## 2016-06-22 DIAGNOSIS — D509 Iron deficiency anemia, unspecified: Secondary | ICD-10-CM | POA: Diagnosis not present

## 2016-06-22 NOTE — Telephone Encounter (Signed)
   Diagnosis:    Result(s)   Card 1: Negative:     Card 2: Negative:   Card 3: Negative:    Completed by: Jefm Miles ,LPN   HEMOCCULT SENSA DEVELOPER: [id number] Marguarite Arbour DATE: 2020-05   HEMOCCULT SENSA CARD:  [id number]   EXPIRATION DATE: 05/20   CARD CONTROL RESULTS:  POSITIVE: Positive  NEGATIVE: Negative    ADDITIONAL COMMENTS: Results forwarded to Lelon Perla.

## 2016-06-23 NOTE — Telephone Encounter (Signed)
Results given to patient

## 2016-06-24 ENCOUNTER — Telehealth (INDEPENDENT_AMBULATORY_CARE_PROVIDER_SITE_OTHER): Payer: Self-pay | Admitting: Internal Medicine

## 2016-06-24 ENCOUNTER — Other Ambulatory Visit (INDEPENDENT_AMBULATORY_CARE_PROVIDER_SITE_OTHER): Payer: Self-pay | Admitting: Internal Medicine

## 2016-06-24 DIAGNOSIS — D649 Anemia, unspecified: Secondary | ICD-10-CM | POA: Insufficient documentation

## 2016-06-24 NOTE — Telephone Encounter (Signed)
Patient called, stated she would like to speak to you, she said she has some questions.  [phone]

## 2016-06-24 NOTE — Telephone Encounter (Signed)
She can continue to the [employer]

## 2016-06-24 NOTE — Telephone Encounter (Signed)
GIVENS capsule sch'd 07/01/16 at 730 (700), patient aware, verbal instructions given to patient

## 2016-06-28 ENCOUNTER — Telehealth (INDEPENDENT_AMBULATORY_CARE_PROVIDER_SITE_OTHER): Payer: Self-pay | Admitting: *Deleted

## 2016-06-28 NOTE — Telephone Encounter (Signed)
noted 

## 2016-06-28 NOTE — Telephone Encounter (Signed)
P/C from patient -- needs to cancel GIVENS capsule, due to her part being 3800.00 and right now she can't afford that and her insurance will be changing, will call at later date to reschedule

## 2016-07-01 ENCOUNTER — Encounter (HOSPITAL_COMMUNITY): Admission: RE | Payer: Self-pay | Source: Ambulatory Visit

## 2016-07-01 ENCOUNTER — Ambulatory Visit (HOSPITAL_COMMUNITY)
Admission: RE | Admit: 2016-07-01 | Payer: BLUE CROSS/BLUE SHIELD | Source: Ambulatory Visit | Admitting: Internal Medicine

## 2016-07-01 SURGERY — IMAGING PROCEDURE, GI TRACT, INTRALUMINAL, VIA CAPSULE

## 2016-07-14 ENCOUNTER — Encounter (INDEPENDENT_AMBULATORY_CARE_PROVIDER_SITE_OTHER): Payer: Self-pay

## 2016-07-15 ENCOUNTER — Other Ambulatory Visit (HOSPITAL_COMMUNITY): Payer: Self-pay

## 2016-07-15 ENCOUNTER — Telehealth (HOSPITAL_COMMUNITY): Payer: Self-pay | Admitting: *Deleted

## 2016-07-15 DIAGNOSIS — E611 Iron deficiency: Secondary | ICD-10-CM

## 2016-07-15 DIAGNOSIS — D75839 Thrombocytosis, unspecified: Secondary | ICD-10-CM

## 2016-07-15 DIAGNOSIS — D649 Anemia, unspecified: Secondary | ICD-10-CM

## 2016-07-15 DIAGNOSIS — D473 Essential (hemorrhagic) thrombocythemia: Secondary | ICD-10-CM

## 2016-07-15 NOTE — Telephone Encounter (Signed)
Reviewed with PA, lab orders entered and faxed to [facility].

## 2016-07-19 DIAGNOSIS — L309 Dermatitis, unspecified: Secondary | ICD-10-CM | POA: Diagnosis not present

## 2016-07-20 ENCOUNTER — Encounter: Payer: Self-pay | Admitting: Oncology

## 2016-07-20 DIAGNOSIS — D509 Iron deficiency anemia, unspecified: Secondary | ICD-10-CM | POA: Diagnosis not present

## 2016-07-20 DIAGNOSIS — D473 Essential (hemorrhagic) thrombocythemia: Secondary | ICD-10-CM | POA: Diagnosis not present

## 2016-07-23 ENCOUNTER — Ambulatory Visit (HOSPITAL_COMMUNITY): Payer: Managed Care, Other (non HMO) | Admitting: Adult Health

## 2016-07-23 ENCOUNTER — Other Ambulatory Visit (HOSPITAL_COMMUNITY): Payer: Self-pay | Admitting: *Deleted

## 2016-07-23 ENCOUNTER — Encounter (HOSPITAL_COMMUNITY): Payer: Self-pay

## 2016-07-23 ENCOUNTER — Encounter (HOSPITAL_COMMUNITY): Payer: BLUE CROSS/BLUE SHIELD | Attending: Oncology | Admitting: Oncology

## 2016-07-23 VITALS — BP 139/73 | HR 87 | Temp 98.0°F | Resp 16 | Wt 182.1 lb

## 2016-07-23 DIAGNOSIS — D473 Essential (hemorrhagic) thrombocythemia: Secondary | ICD-10-CM

## 2016-07-23 DIAGNOSIS — E611 Iron deficiency: Secondary | ICD-10-CM | POA: Diagnosis not present

## 2016-07-23 DIAGNOSIS — D75839 Thrombocytosis, unspecified: Secondary | ICD-10-CM

## 2016-07-23 NOTE — Progress Notes (Signed)
[facility] at [place] NOTE  Patient Care Team: Sharilyn Sites, MD as PCP - General (Family Medicine)  CHIEF COMPLAINTS/PURPOSE OF CONSULTATION:  Thrombocytosis Iron deficiency with serum ferritin 20 ng/ml, iron saturation at 9% Mild elevation of IGA  HISTORY OF PRESENTING ILLNESS:  Kelly Watkins 60 y.o. female is here for follow-up of thrombocytosis and iron deficiency.   She is doing well today. Hgb is 13.9, ferritin 67 on 07/20/16. She has not taken her iron tablet since February because it was causing her to have constipation and hemorrhoids to develop. She was also having stomach pains from the iron, however she was taking it on an empty stomach. Pt has a rash under her breasts and under her axillas. She was recently prescribed diflucan to take for one week for the rash. Denies chest pain, SOB, abdominal pain, or any other concerns. She has been eating well.   MEDICAL HISTORY:  Past Medical History:  Diagnosis Date  . Dysphagia   . GERD (gastroesophageal reflux disease)   . Hypertension     SURGICAL HISTORY: Past Surgical History:  Procedure Laterality Date  . ABDOMINAL HYSTERECTOMY    . COLONOSCOPY    . COLONOSCOPY N/A 06/07/2014   Procedure: COLONOSCOPY;  Surgeon: Rogene Houston, MD;  [facility];  Service: Endoscopy;  Laterality: N/A;  1250  . ESOPHAGOGASTRODUODENOSCOPY N/A 06/07/2014   Procedure: ESOPHAGOGASTRODUODENOSCOPY (EGD);  Surgeon: Rogene Houston, MD;  [facility];  Service: Endoscopy;  Laterality: N/A;  . ESOPHAGOGASTRODUODENOSCOPY (EGD) WITH ESOPHAGEAL DILATION     X 2  . EYE SURGERY    . MALONEY DILATION N/A 06/07/2014   Procedure: [name] DILATION;  Surgeon: Rogene Houston, MD;  [facility];  Service: Endoscopy;  Laterality: N/A;  . [place]  . TUBAL LIGATION      SOCIAL HISTORY: Social History   Social History  . Marital status: Married    Spouse name: N/A  . Number of  children: N/A  . Years of education: N/A   Occupational History  . Not on file.   Social History Main Topics  . Smoking status: Never Smoker  . Smokeless tobacco: Never Used  . Alcohol use No  . Drug use: No  . Sexual activity: Not on file   Other Topics Concern  . Not on file   Social History Narrative  . No narrative on file  Married 1 child 1 grandchild Works at [employer] in [place] smoker. ETOH, none No hobbies currently. Spends time with her granddaughter. She is originally from [place]: Family History  Problem Relation Age of Onset  . Hypertension Mother    indicated that her mother is alive. She indicated that her father is alive. She indicated that her sister is alive. She indicated that her child is alive. She indicated that her other is alive.   Mother was very petite, in her last 67 years she was sick with dementia, heart problems, and a spot on her lung. She passed away this past year at 63 yo. Was a smoker Father is still living. He will be 82 in April. In good health. 2 sisters, fairly healthy  ALLERGIES:  is allergic to hydrochlorothiazide.  MEDICATIONS:  Current Outpatient Prescriptions  Medication Sig Dispense Refill  . CINNAMON PO Take 1 capsule by mouth daily. Reported on 09/19/2015    . clobetasol ointment (TEMOVATE) 0.05 % APPLY TO AFFECTED AREA TWICE  DAILY  0  . Coenzyme Q10 (COQ10) 100 MG CAPS Take by mouth.    . Estradiol 10 MCG TABS vaginal tablet Place vaginally 2 (two) times a week.    . furosemide (LASIX) 20 MG tablet Take 20 mg by mouth as needed. Reported on 09/19/2015    . IFEREX 150 150 MG capsule take 1 capsule by mouth once daily 30 capsule 5  . indomethacin (INDOCIN) 50 MG capsule take 1 capsule by mouth three times a day if needed  0  . KRILL OIL PO Take 2 capsules by mouth daily.     Marland Kitchen losartan (COZAAR) 100 MG tablet daily.   0  . MAGNESIUM PO Take by mouth.    . nystatin cream (MYCOSTATIN) apply topically to  IRRITATED AREAS twice a day  0  . omeprazole (PRILOSEC) 20 MG capsule take 1 capsule by mouth once daily 30 capsule 11  . phentermine (ADIPEX-P) 37.5 MG tablet Take 37.5 mg by mouth daily before breakfast. 1/2 tab daily    . topiramate (TOPAMAX) 50 MG tablet take 1/2 tablet by mouth once daily for 7 days then take 1 tablet once daily THEREAFTER  0   No current facility-administered medications for this visit.    Review of Systems  Constitutional: Negative.   HENT: Negative.   Eyes: Negative.   Respiratory: Negative.  Negative for shortness of breath.   Cardiovascular: Negative.  Negative for chest pain.  Gastrointestinal: Negative.  Negative for abdominal pain.       Hemorrhoids, mild bleeding  Genitourinary: Negative.   Musculoskeletal: Negative.   Skin: Positive for rash (under breasts).  Neurological: Negative.   Endo/Heme/Allergies: Negative.   Psychiatric/Behavioral: Negative.   All other systems reviewed and are negative. 14 point ROS was done and is otherwise as detailed above or in HPI  PHYSICAL EXAMINATION: ECOG PERFORMANCE STATUS: 0 - Asymptomatic  Vitals:   07/23/16 1453  BP: 139/73  Pulse: 87  Resp: 16  Temp: 98 F (36.7 C)   Filed Weights   07/23/16 1453  Weight: 182 lb 1.6 oz (82.6 kg)    Physical Exam  Constitutional: She is oriented to person, place, and time and well-developed, well-nourished, and in no distress.  HENT:  Head: Normocephalic and atraumatic.  Eyes: EOM are normal. Pupils are equal, round, and reactive to light.  Neck: Normal range of motion. Neck supple.  Cardiovascular: Normal rate, regular rhythm and normal heart sounds.   Pulmonary/Chest: Effort normal and breath sounds normal.  Abdominal: Soft. Bowel sounds are normal.  Musculoskeletal: Normal range of motion.  Neurological: She is alert and oriented to person, place, and time. Gait normal.  Skin: Skin is warm and dry.  Nursing note and vitals reviewed.  LABORATORY DATA:  I  have reviewed the data as listed CBC Latest Ref Rng & Units 06/25/2015 11/10/2011  WBC 4.0 - 10.5 K/uL 8.7 7.5  Hemoglobin 12.0 - 15.0 g/dL 13.2 13.3  Hematocrit 36.0 - 46.0 % 39.7 40.2  Platelets 150 - 400 K/uL 389 347   CMP     Component Value Date/Time   NA 141 11/10/2011 1113   K 3.8 11/10/2011 1113   CL 103 11/10/2011 1113   CO2 27 11/10/2011 1113   GLUCOSE 105 (H) 11/10/2011 1113   BUN 17 11/10/2011 1113   CREATININE 0.80 11/10/2011 1113   CALCIUM 9.4 11/10/2011 1113   PROT 7.4 11/10/2011 1113   ALBUMIN 3.8 11/10/2011 1113   AST 16 11/10/2011 1113   ALT  12 11/10/2011 1113   ALKPHOS 79 11/10/2011 1113   BILITOT 0.3 11/10/2011 1113   GFRNONAA 80 (L) 11/10/2011 1113   GFRAA >90 11/10/2011 1113   Outside Labs 06/04/2016   Ferritin  29  Labs 07/20/2016 [facility] WBC   10.8K Hgb   13.9 g/dL Hematocrit  42% Platelet  348K Ferritin  67  ASSESSMENT & PLAN:  Thrombocytosis Iron deficiency with serum ferritin 20 ng/ml, iron saturation at 9% Mild elevation of IGA, normal SPEP, IEP  Labs reviewed with the patient in detail. Hgb 13.9, Ferritin 67.   Stop iron supplements due to her improved counts and her GI symptoms.   Thrombocytosis has resolved, likely due to iron deficiency.  She will return for follow up in 6 months with CBC, CMP, and iron studies.   All questions were answered. The patient knows to call the clinic with any problems, questions or concerns.  This document serves as a record of services personally performed by Twana First, MD. It was created on her behalf by Martinique Casey, a trained medical scribe. The creation of this record is based on the scribe's personal observations and the provider's statements to them. This document has been checked and approved by the attending provider.  I have reviewed the above documentation for accuracy and completeness, and I agree with the above.  This note was electronically signed.    Martinique M Casey  07/23/2016 3:08  PM

## 2016-07-23 NOTE — Patient Instructions (Signed)
[facility] at [facility] Discharge Instructions  RECOMMENDATIONS MADE BY THE CONSULTANT AND ANY TEST RESULTS WILL BE SENT TO YOUR REFERRING PHYSICIAN.  You were seen today by Dr. Twana First Follow up in 6 months with labs a few days before See Amy up front for appointments   Thank you for choosing [facility] to provide your oncology and hematology care.  To afford each patient quality time with our provider, please arrive at least 15 minutes before your scheduled appointment time.    If you have a lab appointment with the [facility] please come in thru the  Main Entrance and check in at the main information desk  You need to re-schedule your appointment should you arrive 10 or more minutes late.  We strive to give you quality time with our providers, and arriving late affects you and other patients whose appointments are after yours.  Also, if you no show three or more times for appointments you may be dismissed from the clinic at the providers discretion.     Again, thank you for choosing [facility]  Our hope is that these requests will decrease the amount of time that you wait before being seen by our physicians.       _____________________________________________________________  Should you have questions after your visit to [facility], please contact our office at [phone] between the hours of 8:30 a.m. and 4:30 p.m.  Voicemails left after 4:30 p.m. will not be returned until the following business day.  For prescription refill requests, have your pharmacy contact our office.       Resources For Cancer Patients and their Caregivers ? American Cancer Society: Can assist with transportation, wigs, general needs, runs Look Good Feel Better.        984 009 7937 ? Cancer Care: Provides financial assistance, online support groups, medication/co-pay assistance.  1-800-813-HOPE  (709)860-6383) ? [facility] Assists [place] cancer patients and their families through emotional , educational and financial support.  (220) 117-8876 ? [place] DSS Where to apply for food stamps, Medicaid and utility assistance. (361)554-4090 ? [facility]: Transportation to medical appointments. (331) 682-3517 ? Social Security Administration: May apply for disability if have a Stage IV cancer. 937 109 3711 786-710-7998 ? [employer], Disability and Transit Services: Assists with nutrition, care and transit needs. [place] Support Programs: @10RELATIVEDAYS @ > Cancer Support Group  2nd Tuesday of the month 1pm-2pm, Journey Room  > Creative Journey  3rd Tuesday of the month 1130am-1pm, Journey Room  > Look Good Feel Better  1st Wednesday of the month 10am-12 noon, Journey Room (Call [facility] to register [phone])

## 2016-08-16 DIAGNOSIS — I10 Essential (primary) hypertension: Secondary | ICD-10-CM | POA: Diagnosis not present

## 2016-08-16 DIAGNOSIS — Z6831 Body mass index (BMI) 31.0-31.9, adult: Secondary | ICD-10-CM | POA: Diagnosis not present

## 2016-08-16 DIAGNOSIS — Z1389 Encounter for screening for other disorder: Secondary | ICD-10-CM | POA: Diagnosis not present

## 2016-08-16 DIAGNOSIS — E6609 Other obesity due to excess calories: Secondary | ICD-10-CM | POA: Diagnosis not present

## 2016-08-16 DIAGNOSIS — L309 Dermatitis, unspecified: Secondary | ICD-10-CM | POA: Diagnosis not present

## 2016-08-19 DIAGNOSIS — A6000 Herpesviral infection of urogenital system, unspecified: Secondary | ICD-10-CM | POA: Diagnosis not present

## 2016-08-31 ENCOUNTER — Ambulatory Visit (INDEPENDENT_AMBULATORY_CARE_PROVIDER_SITE_OTHER): Payer: Managed Care, Other (non HMO) | Admitting: Internal Medicine

## 2016-09-15 ENCOUNTER — Ambulatory Visit (INDEPENDENT_AMBULATORY_CARE_PROVIDER_SITE_OTHER): Payer: BLUE CROSS/BLUE SHIELD | Admitting: Internal Medicine

## 2016-09-24 DIAGNOSIS — M7989 Other specified soft tissue disorders: Secondary | ICD-10-CM | POA: Diagnosis not present

## 2016-09-24 DIAGNOSIS — R600 Localized edema: Secondary | ICD-10-CM | POA: Diagnosis not present

## 2016-09-24 DIAGNOSIS — Z6832 Body mass index (BMI) 32.0-32.9, adult: Secondary | ICD-10-CM | POA: Diagnosis not present

## 2016-09-24 DIAGNOSIS — E6609 Other obesity due to excess calories: Secondary | ICD-10-CM | POA: Diagnosis not present

## 2016-09-24 DIAGNOSIS — Z1389 Encounter for screening for other disorder: Secondary | ICD-10-CM | POA: Diagnosis not present

## 2016-10-25 ENCOUNTER — Encounter (INDEPENDENT_AMBULATORY_CARE_PROVIDER_SITE_OTHER): Payer: Self-pay

## 2016-11-15 DIAGNOSIS — L659 Nonscarring hair loss, unspecified: Secondary | ICD-10-CM | POA: Diagnosis not present

## 2016-11-15 DIAGNOSIS — Z1389 Encounter for screening for other disorder: Secondary | ICD-10-CM | POA: Diagnosis not present

## 2016-11-15 DIAGNOSIS — E6609 Other obesity due to excess calories: Secondary | ICD-10-CM | POA: Diagnosis not present

## 2016-11-15 DIAGNOSIS — Z6831 Body mass index (BMI) 31.0-31.9, adult: Secondary | ICD-10-CM | POA: Diagnosis not present

## 2016-11-15 DIAGNOSIS — R7309 Other abnormal glucose: Secondary | ICD-10-CM | POA: Diagnosis not present

## 2017-01-07 DIAGNOSIS — Z6830 Body mass index (BMI) 30.0-30.9, adult: Secondary | ICD-10-CM | POA: Diagnosis not present

## 2017-01-07 DIAGNOSIS — Z1212 Encounter for screening for malignant neoplasm of rectum: Secondary | ICD-10-CM | POA: Diagnosis not present

## 2017-01-07 DIAGNOSIS — Z01419 Encounter for gynecological examination (general) (routine) without abnormal findings: Secondary | ICD-10-CM | POA: Diagnosis not present

## 2017-01-07 DIAGNOSIS — N9089 Other specified noninflammatory disorders of vulva and perineum: Secondary | ICD-10-CM | POA: Diagnosis not present

## 2017-01-10 DIAGNOSIS — A630 Anogenital (venereal) warts: Secondary | ICD-10-CM | POA: Diagnosis not present

## 2017-01-10 DIAGNOSIS — N9089 Other specified noninflammatory disorders of vulva and perineum: Secondary | ICD-10-CM | POA: Diagnosis not present

## 2017-01-25 DIAGNOSIS — D509 Iron deficiency anemia, unspecified: Secondary | ICD-10-CM | POA: Diagnosis not present

## 2017-01-28 ENCOUNTER — Ambulatory Visit (HOSPITAL_COMMUNITY): Payer: BLUE CROSS/BLUE SHIELD | Admitting: Oncology

## 2017-02-03 DIAGNOSIS — A630 Anogenital (venereal) warts: Secondary | ICD-10-CM | POA: Diagnosis not present

## 2017-02-22 ENCOUNTER — Encounter (HOSPITAL_COMMUNITY): Payer: BLUE CROSS/BLUE SHIELD | Attending: Oncology | Admitting: Oncology

## 2017-02-22 ENCOUNTER — Encounter (HOSPITAL_COMMUNITY): Payer: Self-pay

## 2017-02-22 VITALS — BP 104/92 | HR 64 | Resp 16 | Ht 64.0 in | Wt 187.0 lb

## 2017-02-22 DIAGNOSIS — E611 Iron deficiency: Secondary | ICD-10-CM

## 2017-02-22 DIAGNOSIS — D508 Other iron deficiency anemias: Secondary | ICD-10-CM

## 2017-02-22 DIAGNOSIS — D473 Essential (hemorrhagic) thrombocythemia: Secondary | ICD-10-CM

## 2017-02-22 DIAGNOSIS — D75839 Thrombocytosis, unspecified: Secondary | ICD-10-CM

## 2017-02-22 DIAGNOSIS — R1310 Dysphagia, unspecified: Secondary | ICD-10-CM

## 2017-02-22 NOTE — Progress Notes (Signed)
[facility] at [place] NOTE  Patient Care Team: Sharilyn Sites, MD as PCP - General (Family Medicine)  CHIEF COMPLAINTS/PURPOSE OF CONSULTATION:  Thrombocytosis Iron deficiency  Mild elevation of IGA  HISTORY OF PRESENTING ILLNESS:  Kelly Watkins 61 y.o. female is here for follow-up of thrombocytosis and iron deficiency.   She is doing well today. Patient states that she's been having more dysphagia symptoms and would feel like food gets stuck in her esophagus when she eats certain foods such as hamburgers or steaks. She's had history of requiring esophageal stretching in the past. Otherwise she has been feeling well. She denies any fatigue or generalized weakness. She denies any recent infections. She denies any sources of bleeding including melena, hematochezia, vaginal bleeding, hematuria. Denies chest pain, SOB, abdominal pain, or any other concerns. She has been eating well. Most recent lab work performed on lab core on 01/15/2017 demonstrated a hemoglobin 13.8 g/dL, platelet count 455K.  MEDICAL HISTORY:  Past Medical History:  Diagnosis Date  . Dysphagia   . GERD (gastroesophageal reflux disease)   . Hypertension     SURGICAL HISTORY: Past Surgical History:  Procedure Laterality Date  . ABDOMINAL HYSTERECTOMY    . COLONOSCOPY    . COLONOSCOPY N/A 06/07/2014   Procedure: COLONOSCOPY;  Surgeon: Rogene Houston, MD;  [facility];  Service: Endoscopy;  Laterality: N/A;  1250  . ESOPHAGOGASTRODUODENOSCOPY N/A 06/07/2014   Procedure: ESOPHAGOGASTRODUODENOSCOPY (EGD);  Surgeon: Rogene Houston, MD;  [facility];  Service: Endoscopy;  Laterality: N/A;  . ESOPHAGOGASTRODUODENOSCOPY (EGD) WITH ESOPHAGEAL DILATION     X 2  . EYE SURGERY    . MALONEY DILATION N/A 06/07/2014   Procedure: [name] DILATION;  Surgeon: Rogene Houston, MD;  [facility];  Service: Endoscopy;  Laterality: N/A;  . [place]  . TUBAL  LIGATION      SOCIAL HISTORY: Social History   Social History  . Marital status: Married    Spouse name: N/A  . Number of children: N/A  . Years of education: N/A   Occupational History  . Not on file.   Social History Main Topics  . Smoking status: Never Smoker  . Smokeless tobacco: Never Used  . Alcohol use No  . Drug use: No  . Sexual activity: Not on file   Other Topics Concern  . Not on file   Social History Narrative  . No narrative on file  Married 1 child 1 grandchild Works at [employer] in [place] smoker. ETOH, none No hobbies currently. Spends time with her granddaughter. She is originally from [place]: Family History  Problem Relation Age of Onset  . Hypertension Mother    indicated that her mother is alive. She indicated that her father is alive. She indicated that her sister is alive. She indicated that her child is alive. She indicated that her other is alive.   Mother was very petite, in her last 26 years she was sick with dementia, heart problems, and a spot on her lung. She passed away this past year at 63 yo. Was a smoker Father is still living. He will be 82 in April. In good health. 2 sisters, fairly healthy  ALLERGIES:  is allergic to hydrochlorothiazide.  MEDICATIONS:  Current Outpatient Prescriptions  Medication Sig Dispense Refill  . CINNAMON PO Take 1 capsule by mouth daily. Reported on 09/19/2015    . clobetasol ointment (  TEMOVATE) 0.05 % APPLY TO AFFECTED AREA TWICE DAILY  0  . Coenzyme Q10 (COQ10) 100 MG CAPS Take by mouth.    . Estradiol 10 MCG TABS vaginal tablet Place vaginally 2 (two) times a week.    . furosemide (LASIX) 20 MG tablet Take 20 mg by mouth as needed. Reported on 09/19/2015    . IFEREX 150 150 MG capsule take 1 capsule by mouth once daily 30 capsule 5  . indomethacin (INDOCIN) 50 MG capsule take 1 capsule by mouth three times a day if needed  0  . KRILL OIL PO Take 2 capsules by mouth daily.       Marland Kitchen losartan (COZAAR) 100 MG tablet daily.   0  . nystatin cream (MYCOSTATIN) apply topically to IRRITATED AREAS twice a day  0  . omeprazole (PRILOSEC) 20 MG capsule take 1 capsule by mouth once daily 30 capsule 11   No current facility-administered medications for this visit.    Review of Systems  Constitutional: Negative.   HENT: Negative.   Eyes: Negative.   Respiratory: Negative.  Negative for shortness of breath.   Cardiovascular: Negative.  Negative for chest pain.  Gastrointestinal: Negative.  Negative for abdominal pain.  Genitourinary: Negative.   Musculoskeletal: Negative.   Skin: Negative for rash.  Neurological: Negative.   Endo/Heme/Allergies: Negative.   Psychiatric/Behavioral: Negative.   All other systems reviewed and are negative. 14 point ROS was done and is otherwise as detailed above or in HPI  PHYSICAL EXAMINATION: ECOG PERFORMANCE STATUS: 0 - Asymptomatic  Vitals:   02/22/17 1524  BP: (!) 104/92  Pulse: 64  Resp: 16  SpO2: 97%   Filed Weights   02/22/17 1524  Weight: 187 lb (84.8 kg)    Physical Exam  Constitutional: She is oriented to person, place, and time and well-developed, well-nourished, and in no distress.  HENT:  Head: Normocephalic and atraumatic.  Eyes: Pupils are equal, round, and reactive to light. EOM are normal.  Neck: Normal range of motion. Neck supple.  Cardiovascular: Normal rate, regular rhythm and normal heart sounds.   Pulmonary/Chest: Effort normal and breath sounds normal.  Abdominal: Soft. Bowel sounds are normal.  Musculoskeletal: Normal range of motion.  Neurological: She is alert and oriented to person, place, and time. Gait normal.  Skin: Skin is warm and dry.  Nursing note and vitals reviewed.  LABORATORY DATA:  I have reviewed the data as listed CBC Latest Ref Rng & Units 06/25/2015 11/10/2011  WBC 4.0 - 10.5 K/uL 8.7 7.5  Hemoglobin 12.0 - 15.0 g/dL 13.2 13.3  Hematocrit 36.0 - 46.0 % 39.7 40.2  Platelets 150  - 400 K/uL 389 347   CMP     Component Value Date/Time   NA 141 11/10/2011 1113   K 3.8 11/10/2011 1113   CL 103 11/10/2011 1113   CO2 27 11/10/2011 1113   GLUCOSE 105 (H) 11/10/2011 1113   BUN 17 11/10/2011 1113   CREATININE 0.80 11/10/2011 1113   CALCIUM 9.4 11/10/2011 1113   PROT 7.4 11/10/2011 1113   ALBUMIN 3.8 11/10/2011 1113   AST 16 11/10/2011 1113   ALT 12 11/10/2011 1113   ALKPHOS 79 11/10/2011 1113   BILITOT 0.3 11/10/2011 1113   GFRNONAA 80 (L) 11/10/2011 1113   GFRAA >90 11/10/2011 1113   Outside Labs 06/04/2016   Ferritin  29  Labs 07/20/2016 [facility] WBC   10.8K Hgb   13.9 g/dL Hematocrit  42% Platelet  348K Ferritin  67  ASSESSMENT & PLAN:  Thrombocytosis Iron deficiency with serum ferritin 20 ng/ml, iron saturation at 9% Mild elevation of IGA, normal SPEP, IEP  Labs reviewed with the patient in detail. Hgb 13.8, Ferritin 55, platelet count 455k from 01/15/17.   Patient is not anemic at this time. However, given her worsening thrombocytosis, I have advised patient to again start taking ferrous sulfate 325mg  PO daily with a meal. Continue to monitor her platelets.  She will return for follow up in 6 months with CBC, CMP, and iron studies at [place].   Recommended for her to see GI for her dysphagia symptoms.   All questions were answered. The patient knows to call the clinic with any problems, questions or concerns.   This note was electronically signed.    Twana First, MD  02/22/2017 4:01 PM

## 2017-03-15 ENCOUNTER — Other Ambulatory Visit (HOSPITAL_COMMUNITY): Payer: Self-pay | Admitting: Oncology

## 2017-03-18 ENCOUNTER — Encounter (HOSPITAL_COMMUNITY): Payer: BLUE CROSS/BLUE SHIELD | Attending: Oncology

## 2017-03-18 ENCOUNTER — Encounter (HOSPITAL_COMMUNITY): Payer: Self-pay

## 2017-03-18 VITALS — BP 129/60 | HR 78 | Temp 98.1°F | Resp 18

## 2017-03-18 DIAGNOSIS — E611 Iron deficiency: Secondary | ICD-10-CM | POA: Insufficient documentation

## 2017-03-18 MED ORDER — SODIUM CHLORIDE 0.9 % IV SOLN
510.0000 mg | Freq: Once | INTRAVENOUS | Status: AC
  Administered 2017-03-18: 510 mg via INTRAVENOUS
  Filled 2017-03-18: qty 17

## 2017-03-18 MED ORDER — SODIUM CHLORIDE 0.9 % IV SOLN
Freq: Once | INTRAVENOUS | Status: AC
  Administered 2017-03-18: 10:00:00 via INTRAVENOUS

## 2017-03-18 NOTE — Progress Notes (Signed)
Kelly Watkins tolerated Feraheme infusion well without complaints or incident. VSS upon discharge. Pt discharged self ambulatory in satisfactory condition

## 2017-03-18 NOTE — Patient Instructions (Signed)
[facility] at [facility] Discharge Instructions  RECOMMENDATIONS MADE BY THE CONSULTANT AND ANY TEST RESULTS WILL BE SENT TO YOUR REFERRING PHYSICIAN.  Received Feraheme infusion today.Follow-up as scheduled. Call clinic for any questions or concerns  Thank you for choosing [facility] at [facility] to provide your oncology and hematology care.  To afford each patient quality time with our provider, please arrive at least 15 minutes before your scheduled appointment time.    If you have a lab appointment with the Cancer Center please come in thru the  Main Entrance and check in at the main information desk  You need to re-schedule your appointment should you arrive 10 or more minutes late.  We strive to give you quality time with our providers, and arriving late affects you and other patients whose appointments are after yours.  Also, if you no show three or more times for appointments you may be dismissed from the clinic at the providers discretion.     Again, thank you for choosing [facility].  Our hope is that these requests will decrease the amount of time that you wait before being seen by our physicians.       _____________________________________________________________  Should you have questions after your visit to [facility], please contact our office at [phone] between the hours of 8:30 a.m. and 4:30 p.m.  Voicemails left after 4:30 p.m. will not be returned until the following business day.  For prescription refill requests, have your pharmacy contact our office.       Resources For Cancer Patients and their Caregivers ? American Cancer Society: Can assist with transportation, wigs, general needs, runs Look Good Feel Better.        1-888-227-6333 ? Cancer Care: Provides financial assistance, online support groups, medication/co-pay assistance.  1-800-813-HOPE (4673) ? [facility] Assists [place] cancer patients and their families through emotional , educational and financial support.  336-427-4357 ? [place] DSS Where to apply for food stamps, Medicaid and utility assistance. 336-342-1394 ? [facility]: Transportation to medical appointments. 336-347-2287 ? Social Security Administration: May apply for disability if have a Stage IV cancer. 336-342-7796 1-800-772-1213 ? [place] Aging, Disability and Transit Services: Assists with nutrition, care and transit needs. 336-349-2343  Cancer Center Support Programs: @[email or web address]@ > Cancer Support Group  2nd Tuesday of the month 1pm-2pm, Journey Room  > Creative Journey  3rd Tuesday of the month 1130am-1pm, Journey Room  > Look Good Feel Better  1st Wednesday of the month 10am-12 noon, Journey Room (Call American Cancer Society to register 1-800-395-5775)   

## 2017-03-22 DIAGNOSIS — L309 Dermatitis, unspecified: Secondary | ICD-10-CM | POA: Diagnosis not present

## 2017-07-11 ENCOUNTER — Telehealth (INDEPENDENT_AMBULATORY_CARE_PROVIDER_SITE_OTHER): Payer: Self-pay | Admitting: *Deleted

## 2017-07-11 ENCOUNTER — Telehealth (INDEPENDENT_AMBULATORY_CARE_PROVIDER_SITE_OTHER): Payer: Self-pay | Admitting: Internal Medicine

## 2017-07-11 ENCOUNTER — Other Ambulatory Visit (INDEPENDENT_AMBULATORY_CARE_PROVIDER_SITE_OTHER): Payer: Self-pay | Admitting: Internal Medicine

## 2017-07-11 MED ORDER — OMEPRAZOLE 20 MG PO CPDR: 20.0000 mg | DELAYED_RELEASE_CAPSULE | Freq: Every day | ORAL | 11 refills | Status: DC

## 2017-07-11 NOTE — Telephone Encounter (Signed)
Rx sent to [facility]

## 2017-07-11 NOTE — Telephone Encounter (Signed)
Patient called stating [facility] in [place] is now [facility] and they told her she will need a new prescription of prilosec because the old prescription is a [employer] prescription. Please advise

## 2017-07-29 DIAGNOSIS — E6609 Other obesity due to excess calories: Secondary | ICD-10-CM | POA: Diagnosis not present

## 2017-07-29 DIAGNOSIS — D509 Iron deficiency anemia, unspecified: Secondary | ICD-10-CM | POA: Diagnosis not present

## 2017-07-29 DIAGNOSIS — Z6830 Body mass index (BMI) 30.0-30.9, adult: Secondary | ICD-10-CM | POA: Diagnosis not present

## 2017-07-29 DIAGNOSIS — I10 Essential (primary) hypertension: Secondary | ICD-10-CM | POA: Diagnosis not present

## 2017-07-29 DIAGNOSIS — E782 Mixed hyperlipidemia: Secondary | ICD-10-CM | POA: Diagnosis not present

## 2017-07-29 DIAGNOSIS — R946 Abnormal results of thyroid function studies: Secondary | ICD-10-CM | POA: Diagnosis not present

## 2017-07-29 DIAGNOSIS — Z1389 Encounter for screening for other disorder: Secondary | ICD-10-CM | POA: Diagnosis not present

## 2017-07-29 DIAGNOSIS — D473 Essential (hemorrhagic) thrombocythemia: Secondary | ICD-10-CM | POA: Diagnosis not present

## 2017-07-29 DIAGNOSIS — R7309 Other abnormal glucose: Secondary | ICD-10-CM | POA: Diagnosis not present

## 2017-08-05 ENCOUNTER — Encounter: Payer: Self-pay | Admitting: Oncology

## 2017-08-05 DIAGNOSIS — D473 Essential (hemorrhagic) thrombocythemia: Secondary | ICD-10-CM | POA: Diagnosis not present

## 2017-08-05 DIAGNOSIS — E611 Iron deficiency: Secondary | ICD-10-CM | POA: Diagnosis not present

## 2017-08-05 DIAGNOSIS — E6609 Other obesity due to excess calories: Secondary | ICD-10-CM | POA: Diagnosis not present

## 2017-08-05 DIAGNOSIS — D508 Other iron deficiency anemias: Secondary | ICD-10-CM | POA: Diagnosis not present

## 2017-08-05 DIAGNOSIS — Z1389 Encounter for screening for other disorder: Secondary | ICD-10-CM | POA: Diagnosis not present

## 2017-08-05 DIAGNOSIS — Z6830 Body mass index (BMI) 30.0-30.9, adult: Secondary | ICD-10-CM | POA: Diagnosis not present

## 2017-08-26 ENCOUNTER — Encounter (HOSPITAL_COMMUNITY): Payer: Self-pay | Admitting: Internal Medicine

## 2017-08-26 ENCOUNTER — Other Ambulatory Visit: Payer: Self-pay

## 2017-08-26 ENCOUNTER — Inpatient Hospital Stay (HOSPITAL_COMMUNITY): Payer: BLUE CROSS/BLUE SHIELD | Attending: Hematology | Admitting: Internal Medicine

## 2017-08-26 VITALS — BP 130/71 | HR 76 | Temp 98.3°F | Resp 18 | Ht 64.0 in | Wt 177.0 lb

## 2017-08-26 DIAGNOSIS — D473 Essential (hemorrhagic) thrombocythemia: Secondary | ICD-10-CM

## 2017-08-26 DIAGNOSIS — I10 Essential (primary) hypertension: Secondary | ICD-10-CM | POA: Insufficient documentation

## 2017-08-26 DIAGNOSIS — E611 Iron deficiency: Secondary | ICD-10-CM

## 2017-08-26 NOTE — Progress Notes (Signed)
Diagnosis Iron deficiency - Plan: CBC with Differential/Platelet, Comprehensive metabolic panel, Lactate dehydrogenase, Ferritin, CBC with Differential/Platelet, Comprehensive metabolic panel, Lactate dehydrogenase, Ferritin, Ferritin, Lactate dehydrogenase, Comprehensive metabolic panel, CBC with Differential/Platelet  Staging Cancer Staging No matching staging information was found for the patient.  Assessment and Plan:  1.  IDA.  Pt is a 62 y.o. Female  is here for follow-up of thrombocytosis and iron deficiency.  She had labs done 08/05/2017 that showed white count 8.4 hemoglobin 13.9 platelets 433,000.  Chemistries within normal limits ferritin was 196.  She will return to clinic in 3 months for repeat labs.  She will follow-up in 6 months.  She has been seen by GI.    2.  Thrombocytosis.  Platelet count minimally elevated at 433,000.  This is a normal variant and likely reactive related to iron deficiency.  3.  Dysphasia.  She reports she has undergone GI evaluation with colonoscopy at 8.  She has also been diagnosed with a hiatal hernia.  He should continue to follow-up with GI as recommended.  4.  Hypertension.  Blood pressure is 130/71.  Follow-up with PCP.  5.  Health maintenance.  Mammogram and GI evaluation as recommended.  Current Status: Patient is seen today for follow-up.  She is here today to go over lab studies.  Problem List Patient Active Problem List   Diagnosis Date Noted  . Absolute anemia [D64.9] 06/24/2016  . Iron deficiency [E61.1] 07/20/2015  . Thrombocytosis ([place]) [D47.3] 07/10/2015  . Essential hypertension [I10] 05/29/2014    Past Medical History Past Medical History:  Diagnosis Date  . Dysphagia   . GERD (gastroesophageal reflux disease)   . Hypertension     Past Surgical History Past Surgical History:  Procedure Laterality Date  . ABDOMINAL HYSTERECTOMY    . COLONOSCOPY    . COLONOSCOPY N/A 06/07/2014   Procedure: COLONOSCOPY;  Surgeon:  Rogene Houston, MD;  [facility];  Service: Endoscopy;  Laterality: N/A;  1250  . ESOPHAGOGASTRODUODENOSCOPY N/A 06/07/2014   Procedure: ESOPHAGOGASTRODUODENOSCOPY (EGD);  Surgeon: Rogene Houston, MD;  [facility];  Service: Endoscopy;  Laterality: N/A;  . ESOPHAGOGASTRODUODENOSCOPY (EGD) WITH ESOPHAGEAL DILATION     X 2  . EYE SURGERY    . MALONEY DILATION N/A 06/07/2014   Procedure: [name] DILATION;  Surgeon: Rogene Houston, MD;  [facility];  Service: Endoscopy;  Laterality: N/A;  . [place]  . TUBAL LIGATION      Family History Family History  Problem Relation Age of Onset  . Hypertension Mother      Social History  reports that she has never smoked. She has never used smokeless tobacco. She reports that she does not drink alcohol or use drugs.  Medications  Current Outpatient Medications:  .  CINNAMON PO, Take 1 capsule by mouth daily. Reported on 09/19/2015, Disp: , Rfl:  .  clobetasol ointment (TEMOVATE) 0.05 %, APPLY TO AFFECTED AREA TWICE DAILY, Disp: , Rfl: 0 .  Coenzyme Q10 (COQ10) 100 MG CAPS, Take by mouth., Disp: , Rfl:  .  Estradiol 10 MCG TABS vaginal tablet, Place vaginally 2 (two) times a week., Disp: , Rfl:  .  furosemide (LASIX) 20 MG tablet, Take 20 mg by mouth as needed. Reported on 09/19/2015, Disp: , Rfl:  .  IFEREX 150 150 MG capsule, take 1 capsule by mouth once daily, Disp: 30 capsule, Rfl: 5 .  indomethacin (INDOCIN) 50 MG capsule, take 1 capsule  by mouth three times a day if needed, Disp: , Rfl: 0 .  KRILL OIL PO, Take 2 capsules by mouth daily. , Disp: , Rfl:  .  losartan (COZAAR) 100 MG tablet, daily. , Disp: , Rfl: 0 .  nystatin cream (MYCOSTATIN), apply topically to IRRITATED AREAS twice a day, Disp: , Rfl: 0 .  omeprazole (PRILOSEC) 20 MG capsule, Take 1 capsule (20 mg total) by mouth daily., Disp: 30 capsule, Rfl: 11  Allergies Hydrochlorothiazide  Review of Systems Review of Systems -  Oncology ROS as per HPI otherwise 12 point ROS is negative except trouble swallowing.     Physical Exam  Vitals Wt Readings from Last 3 Encounters:  02/22/17 187 lb (84.8 kg)  07/23/16 182 lb 1.6 oz (82.6 kg)  06/16/16 180 lb (81.6 kg)   Temp Readings from Last 3 Encounters:  03/18/17 98.1 F (36.7 C) (Oral)  07/23/16 98 F (36.7 C) (Oral)  06/16/16 97.8 F (36.6 C)   BP Readings from Last 3 Encounters:  03/18/17 129/60  02/22/17 (!) 104/92  07/23/16 139/73   Pulse Readings from Last 3 Encounters:  03/18/17 78  02/22/17 64  07/23/16 87   Constitutional: Well-developed, well-nourished, and in no distress.   HENT: Head: Normocephalic and atraumatic.  Mouth/Throat: No oropharyngeal exudate. Mucosa moist. Eyes: Pupils are equal, round, and reactive to light. Conjunctivae are normal. No scleral icterus.  Neck: Normal range of motion. Neck supple. No JVD present.  Cardiovascular: Normal rate, regular rhythm and normal heart sounds.  Exam reveals no gallop and no friction rub.   No murmur heard. Pulmonary/Chest: Effort normal and breath sounds normal. No respiratory distress. No wheezes.No rales.  Abdominal: Soft. Bowel sounds are normal. No distension. There is no tenderness. There is no guarding.  Musculoskeletal: No edema or tenderness.  Lymphadenopathy: No cervical, axillary or supraclavicular adenopathy.  Neurological: Alert and oriented to person, place, and time. No cranial nerve deficit.  Skin: Skin is warm and dry. No rash noted. No erythema. No pallor.  Psychiatric: Affect and judgment normal.   Labs No visits with results within 3 Day(s) from this visit.  Latest known visit with results is:  Office Visit on 07/09/2015  Component Date Value Ref Range Status  . Total Protein ELP 07/09/2015 6.6  6.0 - 8.5 g/dL Final  . Albumin ELP 07/09/2015 3.4  2.9 - 4.4 g/dL Final  . Alpha-1-Globulin 07/09/2015 0.2  0.0 - 0.4 g/dL Final  . Alpha-2-Globulin 07/09/2015 0.8  0.4  - 1.0 g/dL Final  . Beta Globulin 07/09/2015 1.4* 0.7 - 1.3 g/dL Final  . Gamma Globulin 07/09/2015 0.8  0.4 - 1.8 g/dL Final  . M-Spike, % 07/09/2015 Not Observed  Not Observed g/dL Final  . SPE Interp. 07/09/2015 Comment   Final   Comment: (NOTE) The SPE pattern appears essentially unremarkable. Evidence of monoclonal protein is not apparent. Performed At: [facility] [place], [place] [id number] Lindon Romp MD TD:[id number]   . Comment 07/09/2015 Comment   Final   Comment: (NOTE) Protein electrophoresis scan will follow via computer, mail, or courier delivery.   Marland Kitchen GLOBULIN, TOTAL 07/09/2015 3.2  2.2 - 3.9 g/dL Corrected  . A/G Ratio 07/09/2015 1.1  0.7 - 1.7 Corrected  . Total Protein ELP 07/09/2015 6.9  6.0 - 8.5 g/dL Final  . IgG (Immunoglobin G), Serum 07/09/2015 848  700 - 1,600 mg/dL Final  . IgA 07/09/2015 387* 87 - 352 mg/dL Final  . IgM, Serum 07/09/2015  75  26 - 217 mg/dL Final   Comment: (NOTE) Performed At: [facility] [place], [place] [id number] Lindon Romp MD OA:[id number]   . Immunofixation Result, Serum 07/09/2015 Comment   Corrected   An apparent normal immunofixation pattern.     Pathology Orders Placed This Encounter  Procedures  . CBC with Differential/Platelet    Standing Status:   Future    Number of Occurrences:   1    Standing Expiration Date:   08/27/2018  . Comprehensive metabolic panel    Standing Status:   Future    Number of Occurrences:   1    Standing Expiration Date:   08/27/2018  . Lactate dehydrogenase    Standing Status:   Future    Number of Occurrences:   1    Standing Expiration Date:   08/27/2018  . Ferritin    Standing Status:   Future    Number of Occurrences:   1    Standing Expiration Date:   08/27/2018  . CBC with Differential/Platelet    Standing Status:   Future    Standing Expiration Date:   08/27/2018  . Comprehensive metabolic panel    Standing Status:   Future     Standing Expiration Date:   08/27/2018  . Lactate dehydrogenase    Standing Status:   Future    Standing Expiration Date:   08/27/2018  . Ferritin    Standing Status:   Future    Standing Expiration Date:   08/27/2018       Zoila Shutter MD

## 2017-08-26 NOTE — Addendum Note (Signed)
Addended by: Farley Ly on: 08/26/2017 01:37 PM   Modules accepted: Orders

## 2017-10-28 DIAGNOSIS — H43813 Vitreous degeneration, bilateral: Secondary | ICD-10-CM | POA: Diagnosis not present

## 2017-12-08 ENCOUNTER — Telehealth (INDEPENDENT_AMBULATORY_CARE_PROVIDER_SITE_OTHER): Payer: Self-pay | Admitting: Internal Medicine

## 2017-12-08 NOTE — Telephone Encounter (Signed)
Patient called stated she is interested in having the [place] procedure - would like to talk to Dr Laural Golden about this - please have him call [phone]

## 2017-12-09 NOTE — Telephone Encounter (Signed)
This will be discussed with Dr.Rehman. 

## 2017-12-15 NOTE — Telephone Encounter (Signed)
Forwarded to Dr.Rehman as a reminder. 

## 2017-12-15 NOTE — Telephone Encounter (Signed)
Talked with Dr.Rehman on 12/14/2017, he will be reviewing the patient's record. After, we will let the patient know his recommendation.

## 2017-12-26 ENCOUNTER — Ambulatory Visit (INDEPENDENT_AMBULATORY_CARE_PROVIDER_SITE_OTHER): Payer: BLUE CROSS/BLUE SHIELD | Admitting: Internal Medicine

## 2017-12-28 ENCOUNTER — Encounter (INDEPENDENT_AMBULATORY_CARE_PROVIDER_SITE_OTHER): Payer: Self-pay | Admitting: Internal Medicine

## 2017-12-28 ENCOUNTER — Ambulatory Visit (INDEPENDENT_AMBULATORY_CARE_PROVIDER_SITE_OTHER): Payer: BLUE CROSS/BLUE SHIELD | Admitting: Internal Medicine

## 2017-12-28 ENCOUNTER — Encounter (INDEPENDENT_AMBULATORY_CARE_PROVIDER_SITE_OTHER): Payer: Self-pay | Admitting: *Deleted

## 2017-12-28 VITALS — BP 140/90 | HR 66 | Resp 18 | Ht 64.0 in | Wt 179.8 lb

## 2017-12-28 DIAGNOSIS — R1319 Other dysphagia: Secondary | ICD-10-CM | POA: Insufficient documentation

## 2017-12-28 DIAGNOSIS — R1310 Dysphagia, unspecified: Secondary | ICD-10-CM | POA: Insufficient documentation

## 2017-12-28 NOTE — Patient Instructions (Signed)
The risks of bleeding, perforation and infection were reviewed with patient.  

## 2017-12-28 NOTE — Progress Notes (Signed)
Subjective:    Patient ID: Kelly Watkins, female    DOB: 22-Mar-1956, 62 y.o.   MRN: [id number]  HPI Presents today with c/o dysphagia. Symptoms x 1 year but is occurring more frequently. GERD controlled with the Prilosec. If she misses over 4 days, she will have acid reflux.  Her appetite is good.  States has IDA. Had iron transfusion about 6 months ago. Last seen at [facility] in April of this year. She was scheduled for a Given's capsule in February of last year but cancelled due to high co-pay.  She is interested in [place] procedure. I will let Dr. Laural Watkins aware.  She wants to come off the Prilosec. 06/23/2015 were negative.  08/05/2017 H and H 13.9 and 41.5,platelets 433, ferritin 196, Iron 97, Iron sat 31, TIBC 315, UIBC 217 Her last EGD/ED was in 06/07/2014. Procedure:   EGD with gastric polypectomy, ED & Colonoscopy Impression:  EGD findings; Nonobstructive Schatzki's ring dilated by passing 56 French Maloney dilator but ring had to be disrupted with focal biopsy. Small sliding hiatal hernia. Multiple gastric polyps ranging in size from 4-15 mm. Three of these polyps were hot snared and retrieved for histologic examination.  Colonoscopy findings; Small external hemorrhoids otherwise normal colonoscopy..  Recommendations:  Standard instructions given. No aspirin or NSAIDs for 10 weeks. Patient advised to take omeprazole 20 mg daily rather than when necessary. Next screening for CRC in 10 years.    12/10/2005 EGD/ED: INDICATIONS: Kelly Watkins is a 62 year old Caucasian female with intermittent solid food dysphagia. She has history of distal esophageal ring, and her last EGD/ED was in July 2005. She had barium pill study which showed narrowing to distal esophagus and was a moderate size sliding hiatal hernia. Barium pill did passed through this area without any FINAL DIAGNOSES: 1. Two distal esophageal rings which were disrupted with 19- to 20-mm  balloon. The  passage of 56-French [facility] dilator did not induce  mucosal disruption. 2. Small sliding hiatal hernia. 3. Multiple hyperplastic gastric polyps which were less than 8 mm in  diameter. 4. Few tiny antral vascular ectasia.  11/22/2003 EGD/Colonoscopy:  PROCEDURE: Esophagogastroduodenoscopy with esophageal dilatation followed by total colonoscopy.  INDICATIONS: Kelly Watkins is a 62 year old Caucasian female who has an 31-month history of intermittent solid food dysphagia. She does not have heartburn. She will be 62 years old in a few months. She, therefore, also is interested in undergoing screening colonoscopy. Both procedures were reviewed with the patient and informed consent was obtained.     Review of Systems Past Medical History:  Diagnosis Date  . Dysphagia   . GERD (gastroesophageal reflux disease)   . Hypertension     Past Surgical History:  Procedure Laterality Date  . ABDOMINAL HYSTERECTOMY    . COLONOSCOPY    . COLONOSCOPY N/A 06/07/2014   Procedure: COLONOSCOPY;  Surgeon: Rogene Houston, MD;  [facility];  Service: Endoscopy;  Laterality: N/A;  1250  . ESOPHAGOGASTRODUODENOSCOPY N/A 06/07/2014   Procedure: ESOPHAGOGASTRODUODENOSCOPY (EGD);  Surgeon: Rogene Houston, MD;  [facility];  Service: Endoscopy;  Laterality: N/A;  . ESOPHAGOGASTRODUODENOSCOPY (EGD) WITH ESOPHAGEAL DILATION     X 2  . EYE SURGERY    . MALONEY DILATION N/A 06/07/2014   Procedure: [name] DILATION;  Surgeon: Rogene Houston, MD;  [facility];  Service: Endoscopy;  Laterality: N/A;  . [place]  . TUBAL LIGATION      Allergies  Allergen Reactions  .  Hydrochlorothiazide Itching    Current Outpatient Medications on File Prior to Visit  Medication Sig Dispense Refill  . clobetasol ointment (TEMOVATE) 0.05 % APPLY TO AFFECTED AREA TWICE DAILY  0  . Coenzyme Q10 (COQ10) 100 MG CAPS Take by mouth daily.     . Estradiol 10  MCG TABS vaginal tablet Place vaginally 2 (two) times a week.    . furosemide (LASIX) 20 MG tablet Take 20 mg by mouth as needed. Reported on 09/19/2015    . KRILL OIL PO Take 2 capsules by mouth daily.     Marland Kitchen losartan (COZAAR) 100 MG tablet daily.   0  . nystatin cream (MYCOSTATIN) apply topically to IRRITATED AREAS twice a day  0  . omeprazole (PRILOSEC) 20 MG capsule Take 1 capsule (20 mg total) by mouth daily. 30 capsule 11   No current facility-administered medications on file prior to visit.         Objective:   Physical Exam Blood pressure 140/90, pulse 66, resp. rate 18, height 5\' 4"  (1.626 m), weight 179 lb 12.8 oz (81.6 kg). Alert and oriented. Skin warm and dry. Oral mucosa is moist.   . Sclera anicteric, conjunctivae is pink. Thyroid not enlarged. No cervical lymphadenopathy. Lungs clear. Heart regular rate and rhythm.  Abdomen is soft. Bowel sounds are positive. No hepatomegaly. No abdominal masses felt. No tenderness.  No edema to lower extremities.  Rectal: Hemorrhoid noted.          Assessment & Plan:  Dysphagia. Need EGD/ED., IDA. Followed by the [place]. Stool cards x 3 sent home with her all were negative in 2017. Iron studies are normal as of March of this year.  Patient is interest in Nissen Surgery.

## 2018-01-04 ENCOUNTER — Telehealth (INDEPENDENT_AMBULATORY_CARE_PROVIDER_SITE_OTHER): Payer: Self-pay | Admitting: Internal Medicine

## 2018-01-04 NOTE — Telephone Encounter (Signed)
Left message for Clarise Cruz @ [facility] to give me a call to sch'd appt

## 2018-01-04 NOTE — Telephone Encounter (Signed)
Ms. Vaughan Basta, NP asked me to call patient regarding chronic GERD symptoms. Patient told me that she is interested in having antireflux surgery. She states her serum iron has dropped again.  She wants to come off PPI. She has a friend who had antireflux surgery in [place] where she lives.  She has done well. Patient wants to be referred to [facility]. Will make an appointment for her to see Dr. Rosendo Gros of [facility] surgery.  Ann, please arrange for office visit with Dr. Rosendo Gros.

## 2018-01-06 NOTE — Telephone Encounter (Signed)
appt with Dr Justine Null 01/19/18 at 1020, patient aware

## 2018-01-12 NOTE — Telephone Encounter (Signed)
Patient has been referred to Dr. Rosendo Gros for surgical consultation.

## 2018-02-01 ENCOUNTER — Ambulatory Visit: Payer: Self-pay | Admitting: General Surgery

## 2018-02-01 DIAGNOSIS — K449 Diaphragmatic hernia without obstruction or gangrene: Secondary | ICD-10-CM | POA: Diagnosis not present

## 2018-02-01 NOTE — H&P (Signed)
History of Present Illness Ralene Ok MD; 02/01/2018 4:20 PM) The patient is a 62 year old female who presents with a hiatal hernia. Referred by: Dr. Laural Golden Chief Complaint: Reflux  Patient is a 62 year old female who comes in with a history of hypertension, reflux, hiatal hernia. Patient states that she's had a 20-ish year history of reflux. Patient's been on multiple PPIs during this time. Patient had a previous endoscopy which revealed a small hiatal hernia. Patient was also found to have gastric polyps. I thought by her GI physician was that these were likely due to her Protonix. Patient is concerned about side effects of the Protonix and due to her reflux will like to have reflux surgery and repair were hiatal hernia.  Patient's had a previous laparoscopic hysterectomy.    Past Surgical History (Tanisha A. Owens Shark, [place]; 02/01/2018 3:47 PM) Cataract Surgery  Bilateral. Hysterectomy (not due to cancer) - Complete  Oral Surgery   Diagnostic Studies History (Tanisha A. Owens Shark, [place]; 02/01/2018 3:47 PM) Colonoscopy  1-5 years ago Mammogram  within last year Pap Smear  1-5 years ago  Allergies (Tanisha A. Owens Shark, [place]; 02/01/2018 3:48 PM) hydroCHLOROthiazide *DIURETICS*  Itching. Allergies Reconciled   Medication History (Tanisha A. Owens Shark, [employer]; 02/01/2018 3:48 PM) Omeprazole (20MG  Capsule DR, Oral) Active. Losartan Potassium (100MG  Tablet, Oral) Active. Medications Reconciled  Social History (Tanisha A. Owens Shark, [place]; 02/01/2018 3:47 PM) Caffeine use  Carbonated beverages, Coffee, Tea. No alcohol use  No drug use  Tobacco use  Never smoker.  Family History (Tanisha A. Owens Shark, [place]; 02/01/2018 3:47 PM) Cerebrovascular Accident  Mother. Heart Disease  Mother. Heart disease in female family member before age 55  Hypertension  Father, Mother. Melanoma  Mother.  Pregnancy / Birth History (Tanisha A. Owens Shark, [place]; 02/01/2018 3:47 PM) Age at menarche  75 years. Age  of menopause  <43 Contraceptive History  Oral contraceptives. Gravida  1 Maternal age  14-30 Para  1  Other Problems (Tanisha A. Owens Shark, [place]; 02/01/2018 3:47 PM) Gastroesophageal Reflux Disease  Hemorrhoids  High blood pressure  Hypercholesterolemia  Oophorectomy  Bilateral. Transfusion history     Review of Systems Ralene Ok MD; 02/01/2018 4:18 PM) General Present- Fatigue and Weight Gain. Not Present- Appetite Loss, Chills, Fever, Night Sweats and Weight Loss. Skin Present- Dryness. Not Present- Change in Wart/Mole, Hives, Jaundice, New Lesions, Non-Healing Wounds, Rash and Ulcer. HEENT Present- Hoarseness, Seasonal Allergies and Wears glasses/contact lenses. Not Present- Earache, Hearing Loss, Nose Bleed, Oral Ulcers, Ringing in the Ears, Sinus Pain, Sore Throat, Visual Disturbances and Yellow Eyes. Respiratory Present- Snoring. Not Present- Bloody sputum, Chronic Cough, Difficulty Breathing and Wheezing. Breast Not Present- Breast Mass, Breast Pain, Nipple Discharge and Skin Changes. Cardiovascular Present- Leg Cramps and Swelling of Extremities. Not Present- Chest Pain, Difficulty Breathing Lying Down, Palpitations, Rapid Heart Rate and Shortness of Breath. Gastrointestinal Present- Difficulty Swallowing and Hemorrhoids. Not Present- Abdominal Pain, Bloating, Bloody Stool, Change in Bowel Habits, Chronic diarrhea, Constipation, Excessive gas, Gets full quickly at meals, Indigestion, Nausea, Rectal Pain and Vomiting. Musculoskeletal Not Present- Back Pain, Joint Pain, Joint Stiffness, Muscle Pain, Muscle Weakness and Swelling of Extremities. Neurological Not Present- Decreased Memory, Fainting, Headaches, Numbness, Seizures, Tingling, Tremor, Trouble walking and Weakness. Psychiatric Not Present- Anxiety, Bipolar, Change in Sleep Pattern, Depression, Fearful and Frequent crying. Endocrine Present- Cold Intolerance and Excessive Hunger. Not Present- Hair Changes, Heat  Intolerance, Hot flashes and New Diabetes. Hematology Not Present- Blood Thinners, Easy Bruising, Excessive bleeding, Gland problems, HIV and Persistent Infections. All  other systems negative  Vitals (Tanisha A. Brown RMA; 02/01/2018 3:47 PM) 02/01/2018 3:47 PM Weight: 184.6 lb Height: 64in Body Surface Area: 1.89 m Body Mass Index: 31.69 kg/m  Temp.: 97.70F  Pulse: 94 (Regular)  BP: 136/88 (Sitting, Left Arm, Standard)       Physical Exam Ralene Ok MD; 02/01/2018 4:20 PM) The physical exam findings are as follows: Note:Constitutional: No acute distress, conversant, appears stated age  Eyes: Anicteric sclerae, moist conjunctiva, no lid lag  Neck: No thyromegaly, trachea midline, no cervical lymphadenopathy  Lungs: Clear to auscultation biilaterally, normal respiratory effot  Cardiovascular: regular rate & rhythm, no murmurs, no peripheal edema, pedal pulses 2+  GI: Soft, no masses or hepatosplenomegaly, non-tender to palpation  MSK: Normal gait, no clubbing cyanosis, edema  Skin: No rashes, palpation reveals normal skin turgor  Psychiatric: Appropriate judgment and insight, oriented to person, place, and time    Assessment & Plan Ralene Ok MD; 02/01/2018 4:22 PM) HIATAL HERNIA (K44.9) Impression: 62 year old female with a history of hypertension, reflux, hiatal hernia Patient undergo endoscopy by Dr. Laural Golden 1. We will obtain a esophagogram to evaluate the hiatal hernia 2. Will proceed to the operating room for a robotic hiatal hernia repair with mesh and Nissen fundoplication  3. Discussed with patient the risks and benefits of the procedure to include but not limited to: Infection, bleeding, damage to structures, possible pneumothorax, possible recurrence. The patient voiced understanding and wishes to proceed.

## 2018-02-03 ENCOUNTER — Other Ambulatory Visit (HOSPITAL_COMMUNITY): Payer: Self-pay | Admitting: General Surgery

## 2018-02-03 DIAGNOSIS — K449 Diaphragmatic hernia without obstruction or gangrene: Secondary | ICD-10-CM

## 2018-02-10 ENCOUNTER — Ambulatory Visit (HOSPITAL_COMMUNITY)
Admission: RE | Admit: 2018-02-10 | Discharge: 2018-02-10 | Disposition: A | Discharge status: Home / Self Care | Payer: BLUE CROSS/BLUE SHIELD | Source: Ambulatory Visit | Attending: General Surgery | Admitting: General Surgery

## 2018-02-10 DIAGNOSIS — K449 Diaphragmatic hernia without obstruction or gangrene: Secondary | ICD-10-CM | POA: Diagnosis not present

## 2018-02-10 DIAGNOSIS — K210 Gastro-esophageal reflux disease with esophagitis: Secondary | ICD-10-CM | POA: Diagnosis not present

## 2018-02-10 DIAGNOSIS — K219 Gastro-esophageal reflux disease without esophagitis: Secondary | ICD-10-CM | POA: Diagnosis not present

## 2018-02-10 DIAGNOSIS — K224 Dyskinesia of esophagus: Secondary | ICD-10-CM | POA: Insufficient documentation

## 2018-02-23 ENCOUNTER — Ambulatory Visit (HOSPITAL_COMMUNITY)
Admission: RE | Admit: 2018-02-23 | Discharge: 2018-02-23 | Disposition: A | Discharge status: Home / Self Care | Payer: BLUE CROSS/BLUE SHIELD | Source: Ambulatory Visit | Attending: Internal Medicine | Admitting: Internal Medicine

## 2018-02-23 ENCOUNTER — Encounter (HOSPITAL_COMMUNITY)
Admission: RE | Disposition: A | Discharge status: Home / Self Care | Payer: Self-pay | Source: Ambulatory Visit | Attending: Internal Medicine

## 2018-02-23 ENCOUNTER — Ambulatory Visit (HOSPITAL_COMMUNITY): Payer: BLUE CROSS/BLUE SHIELD | Admitting: Internal Medicine

## 2018-02-23 ENCOUNTER — Encounter (HOSPITAL_COMMUNITY): Payer: Self-pay

## 2018-02-23 ENCOUNTER — Other Ambulatory Visit: Payer: Self-pay

## 2018-02-23 DIAGNOSIS — K449 Diaphragmatic hernia without obstruction or gangrene: Secondary | ICD-10-CM | POA: Insufficient documentation

## 2018-02-23 DIAGNOSIS — K317 Polyp of stomach and duodenum: Secondary | ICD-10-CM | POA: Diagnosis not present

## 2018-02-23 DIAGNOSIS — K31819 Angiodysplasia of stomach and duodenum without bleeding: Secondary | ICD-10-CM | POA: Diagnosis not present

## 2018-02-23 DIAGNOSIS — Z79899 Other long term (current) drug therapy: Secondary | ICD-10-CM | POA: Diagnosis not present

## 2018-02-23 DIAGNOSIS — Z01818 Encounter for other preprocedural examination: Secondary | ICD-10-CM | POA: Insufficient documentation

## 2018-02-23 DIAGNOSIS — I10 Essential (primary) hypertension: Secondary | ICD-10-CM | POA: Diagnosis not present

## 2018-02-23 DIAGNOSIS — R1310 Dysphagia, unspecified: Secondary | ICD-10-CM | POA: Insufficient documentation

## 2018-02-23 DIAGNOSIS — K219 Gastro-esophageal reflux disease without esophagitis: Secondary | ICD-10-CM | POA: Insufficient documentation

## 2018-02-23 DIAGNOSIS — R1319 Other dysphagia: Secondary | ICD-10-CM | POA: Insufficient documentation

## 2018-02-23 HISTORY — PX: ESOPHAGOGASTRODUODENOSCOPY: SHX5428

## 2018-02-23 SURGERY — EGD (ESOPHAGOGASTRODUODENOSCOPY)
Anesthesia: Moderate Sedation

## 2018-02-23 MED ORDER — LIDOCAINE VISCOUS HCL 2 % MT SOLN
OROMUCOSAL | Status: DC | PRN
  Administered 2018-02-23: 4 mL via OROMUCOSAL

## 2018-02-23 MED ORDER — LIDOCAINE VISCOUS HCL 2 % MT SOLN
OROMUCOSAL | Status: AC
  Filled 2018-02-23: qty 15

## 2018-02-23 MED ORDER — MIDAZOLAM HCL 5 MG/5ML IJ SOLN
INTRAMUSCULAR | Status: AC
  Filled 2018-02-23: qty 10

## 2018-02-23 MED ORDER — MEPERIDINE HCL 50 MG/ML IJ SOLN
INTRAMUSCULAR | Status: AC
  Filled 2018-02-23: qty 1

## 2018-02-23 MED ORDER — MIDAZOLAM HCL 5 MG/5ML IJ SOLN
INTRAMUSCULAR | Status: DC | PRN
  Administered 2018-02-23 (×3): 2 mg via INTRAVENOUS

## 2018-02-23 MED ORDER — MEPERIDINE HCL 50 MG/ML IJ SOLN
INTRAMUSCULAR | Status: DC | PRN
  Administered 2018-02-23 (×2): 25 mg

## 2018-02-23 MED ORDER — SODIUM CHLORIDE 0.9 % IV SOLN
INTRAVENOUS | Status: DC
  Administered 2018-02-23: 07:00:00 via INTRAVENOUS

## 2018-02-23 NOTE — H&P (Signed)
Kelly Watkins is an 62 y.o. female.   Chief Complaint: Patient is here for EGD. HPI: Patient is 62 year old Caucasian female with chronic GERD and decided to proceed with surgery so that she would not have to take PPI chronically.  She says heartburn is well controlled as long as she watches her diet and takes medication.  She has occasional dysphagia to solids.  She saw Dr. Rosendo Gros and had upper GI series which revealed moderate sliding hiatal hernia and mild tertiary contractions. She denies nausea vomiting melena or abdominal pain. 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   Past Medical History:  Diagnosis Date  . Dysphagia   . GERD (gastroesophageal reflux disease)   . Hypertension     Past Surgical History:  Procedure Laterality Date  . ABDOMINAL HYSTERECTOMY    . COLONOSCOPY    . COLONOSCOPY N/A 06/07/2014   Procedure: COLONOSCOPY;  Surgeon: Rogene Houston, MD;  Location: [facility];  Service: Endoscopy;  Laterality: N/A;  1250  . ESOPHAGOGASTRODUODENOSCOPY N/A 06/07/2014   Procedure: ESOPHAGOGASTRODUODENOSCOPY (EGD);  Surgeon: Rogene Houston, MD;  Location: [facility];  Service: Endoscopy;  Laterality: N/A;  . ESOPHAGOGASTRODUODENOSCOPY (EGD) WITH ESOPHAGEAL DILATION     X 2  . EYE SURGERY    . MALONEY DILATION N/A 06/07/2014   Procedure: [name] DILATION;  Surgeon: Rogene Houston, MD;  Location: [facility];  Service: Endoscopy;  Laterality: N/A;  . [place]  . TUBAL LIGATION      Family History  Problem Relation Age of Onset  . Hypertension Mother    Social History:  reports that she has never smoked. She has never used smokeless tobacco. She reports that she does not drink alcohol or use drugs.  Allergies:  Allergies  Allergen Reactions  . Hydrochlorothiazide Itching  . Penicillins Itching  . Sulfur     Medications Prior to  Admission  Medication Sig Dispense Refill  . cetirizine (ZYRTEC) 10 MG tablet Take 10 mg by mouth daily.    . clobetasol ointment (TEMOVATE) 1.93 % Apply 1 application topically daily.   0  . Coenzyme Q10 (COQ10) 100 MG CAPS Take 100 mg by mouth daily.     . cycloSPORINE (RESTASIS) 0.05 % ophthalmic emulsion Place 1 drop into both eyes daily.    . Estradiol 10 MCG TABS vaginal tablet Place vaginally 2 (two) times a week.    . furosemide (LASIX) 20 MG tablet Take 20 mg by mouth as needed for fluid or edema.     Javier Docker Oil 500 MG CAPS Take 500 mg by mouth daily.     Marland Kitchen losartan (COZAAR) 100 MG tablet Take 100 mg by mouth daily.   0  . omeprazole (PRILOSEC) 20 MG capsule Take 1 capsule (20 mg total) by mouth daily. 30 capsule 11  . Prenatal Vit-Fe Fumarate-FA (PRENATAL MULTIVITAMIN) TABS tablet Take 1 tablet by mouth daily at 12 noon.      No results found for this or any previous visit (from the past 48 hour(s)). No results found.  ROS  Blood pressure (!) 149/84, pulse 72, temperature 98.6 F (37 C), temperature source Oral, resp. rate 12, SpO2 95 %. Physical Exam  Constitutional: She appears well-developed and well-nourished.  HENT:  Mouth/Throat: Oropharynx is clear and moist.  Eyes: Conjunctivae are normal. No scleral icterus.  Neck: No  thyromegaly present.  Cardiovascular: Normal rate, regular rhythm and normal heart sounds.  No murmur heard. Respiratory: Effort normal and breath sounds normal.  GI: Soft. Bowel sounds are normal.  Musculoskeletal: She exhibits no edema.  Lymphadenopathy:    She has no cervical adenopathy.  Neurological: She is alert.  Skin: Skin is warm and dry.     Assessment/Plan Chronic GERD. She has scheduled for antireflux surgery. Preprocedure diagnostic EGD.  Hildred Laser, MD 02/23/2018, 7:31 AM

## 2018-02-23 NOTE — Discharge Instructions (Signed)
Resume usual medications and diet as before. No driving for 24 hours.   Hiatal hernia A hiatal hernia occurs when part of the stomach slides above the muscle that separates the abdomen from the chest (diaphragm). A person can be born with a hiatal hernia (congenital), or it may develop over time. In almost all cases of hiatal hernia, only the top part of the stomach pushes through the diaphragm. Many people have a hiatal hernia with no symptoms. The larger the hernia, the more likely it is that you will have symptoms. In some cases, a hiatal hernia allows stomach acid to flow back into the tube that carries food from your mouth to your stomach (esophagus). This may cause heartburn symptoms. Severe heartburn symptoms may mean that you have developed a condition called gastroesophageal reflux disease (GERD). What are the causes? This condition is caused by a weakness in the opening (hiatus) where the esophagus passes through the diaphragm to attach to the upper part of the stomach. A person may be born with a weakness in the hiatus, or a weakness can develop over time. What increases the risk? This condition is more likely to develop in:  Older people. Age is a major risk factor for a hiatal hernia, especially if you are over the age of 62.  Pregnant women.  People who are overweight.  People who have frequent constipation.  What are the signs or symptoms? Symptoms of this condition usually develop in the form of GERD symptoms. Symptoms include:  Heartburn.  Belching.  Indigestion.  Trouble swallowing.  Coughing or wheezing.  Sore throat.  Hoarseness.  Chest pain.  Nausea and vomiting.  How is this diagnosed? This condition may be diagnosed during testing for GERD. Tests that may be done include:  X-rays of your stomach or chest.  An upper gastrointestinal (GI) series. This is an X-ray exam of your GI tract that is taken after you swallow a chalky liquid that shows up  clearly on the X-ray.  Endoscopy. This is a procedure to look into your stomach using a thin, flexible tube that has a tiny camera and light on the end of it.  How is this treated? This condition may be treated by:  Dietary and lifestyle changes to help reduce GERD symptoms.  Medicines. These may include: ? Over-the-counter antacids. ? Medicines that make your stomach empty more quickly. ? Medicines that block the production of stomach acid (H2 blockers). ? Stronger medicines to reduce stomach acid (proton pump inhibitors).  Surgery to repair the hernia, if other treatments are not helping.  If you have no symptoms, you may not need treatment. Follow these instructions at home: Lifestyle and activity  Do not use any products that contain nicotine or tobacco, such as cigarettes and e-cigarettes. If you need help quitting, ask your health care provider.  Try to achieve and maintain a healthy body weight.  Avoid putting pressure on your abdomen. Anything that puts pressure on your abdomen increases the amount of acid that may be pushed up into your esophagus. ? Avoid bending over, especially after eating. ? Raise the head of your bed by putting blocks under the legs. This keeps your head and esophagus higher than your stomach. ? Do not wear tight clothing around your chest or stomach. ? Try not to strain when having a bowel movement, when urinating, or when lifting heavy objects. Eating and drinking  Avoid foods that can worsen GERD symptoms. These may include: ? Fatty foods, like fried  foods. ? Citrus fruits, like oranges or lemon. ? Other foods and drinks that contain acid, like orange juice or tomatoes. ? Spicy food. ? Chocolate.  Eat frequent small meals instead of three large meals a day. This helps prevent your stomach from getting too full. ? Eat slowly. ? Do not lie down right after eating. ? Do not eat 1-2 hours before bed.  Do not drink beverages with caffeine.  These include cola, coffee, cocoa, and tea.  Do not drink alcohol. General instructions  Take over-the-counter and prescription medicines only as told by your health care provider.  Keep all follow-up visits as told by your health care provider. This is important. Contact a health care provider if:  Your symptoms are not controlled with medicines or lifestyle changes.  You are having trouble swallowing.  You have coughing or wheezing that will not go away. Get help right away if:  Your pain is getting worse.  Your pain spreads to your arms, neck, jaw, teeth, or back.  You have shortness of breath.  You sweat for no reason.  You feel sick to your stomach (nauseous) or you vomit.  You vomit blood.  You have bright red blood in your stools.  You have black, tarry stools. This information is not intended to replace advice given to you by your health care provider. Make sure you discuss any questions you have with your health care provider. Document Released: 07/24/2003 Document Revised: 04/26/2016 Document Reviewed: 04/26/2016 Elsevier Interactive Patient Education  2018 [employer]. Gastric Polyps A gastric polyp, also called a stomach polyp, is a growth on the lining of the stomach. Most polyps are not dangerous, but some can be harmful because of their size, location, or type. Polyps that can become harmful include:  Large polyps. These can turn into sores (ulcers). Ulcers can lead to stomach bleeding.  Polyps that block food from moving from the stomach to the small intestine (gastric outlet obstruction).  A type of polyp called an adenoma. This type of polyp can become cancerous.  What are the causes? Gastric polyps form when the lining of the stomach gets inflamed or damaged. Stomach inflammation and damage may be caused by:  A long-lasting stomach condition, such as gastritis.  Certain medicines used to reduce stomach acid.  An inherited condition called  familial adenomatous polyposis.  What are the signs or symptoms? Usually, this condition does not cause any symptoms. If you do have symptoms, they may include:  Pain or tenderness in the abdomen.  Nausea.  Trouble eating or swallowing.  Blood in the stool.  Anemia.  How is this diagnosed? Gastric polyps are diagnosed with:  A medical procedure called endoscopy.  A lab test in which a part of the polyp is examined. This test is done with a sample of polyp tissue (biopsy) taken during an endoscopy.  How is this treated? Treatment depends on the type, location, and size of the polyps. Treatment may involve:  Having the polyps checked regularly with an endoscopy.  Having the polyps removed with an endoscopy. This may be done if the polyps are harmful or can become harmful. Removing a polyp often prevents problems from developing.  Having the polyps removed with a surgery called a partial gastrectomy. This may be done in rare cases to remove very large polyps.  Treating the underlying condition that caused the polyps.  Follow these instructions at home:  Take over-the-counter and prescription medicines only as told by your health care provider.  Keep all follow-up visits as told by your health care provider. This is important. Contact a health care provider if:  You develop new symptoms.  Your symptoms get worse. Get help right away if:  You vomit blood.  You have severe abdominal pain.  You cannot eat or drink.  You have blood in your stool. This information is not intended to replace advice given to you by your health care provider. Make sure you discuss any questions you have with your health care provider. Document Released: 04/19/2012 Document Revised: 09/22/2015 Document Reviewed: 05/18/2015 Elsevier Interactive Patient Education  2018 [employer]. Esophagogastroduodenoscopy Esophagogastroduodenoscopy (EGD) is a procedure to examine the lining of the  esophagus, stomach, and first part of the small intestine (duodenum). This procedure is done to check for problems such as inflammation, bleeding, ulcers, or growths. During this procedure, a long, flexible, lighted tube with a camera attached (endoscope) is inserted down the throat. Tell a health care provider about:  Any allergies you have.  All medicines you are taking, including vitamins, herbs, eye drops, creams, and over-the-counter medicines.  Any problems you or family members have had with anesthetic medicines.  Any blood disorders you have.  Any surgeries you have had.  Any medical conditions you have.  Whether you are pregnant or may be pregnant. What are the risks? Generally, this is a safe procedure. However, problems may occur, including:  Infection.  Bleeding.  A tear (perforation) in the esophagus, stomach, or duodenum.  Trouble breathing.  Excessive sweating.  Spasms of the larynx.  A slowed heartbeat.  Low blood pressure.  What happens before the procedure?  Follow instructions from your health care provider about eating or drinking restrictions.  Ask your health care provider about: ? Changing or stopping your regular medicines. This is especially important if you are taking diabetes medicines or blood thinners. ? Taking medicines such as aspirin and ibuprofen. These medicines can thin your blood. Do not take these medicines before your procedure if your health care provider instructs you not to.  Plan to have someone take you home after the procedure.  If you wear dentures, be ready to remove them before the procedure. What happens during the procedure?  To reduce your risk of infection, your health care team will wash or sanitize their hands.  An IV tube will be put in a vein in your hand or arm. You will get medicines and fluids through this tube.  You will be given one or more of the following: ? A medicine to help you relax  (sedative). ? A medicine to numb the area (local anesthetic). This medicine may be sprayed into your throat. It will make you feel more comfortable and keep you from gagging or coughing during the procedure. ? A medicine for pain.  A mouth guard may be placed in your mouth to protect your teeth and to keep you from biting on the endoscope.  You will be asked to lie on your left side.  The endoscope will be lowered down your throat into your esophagus, stomach, and duodenum.  Air will be put into the endoscope. This will help your health care provider see better.  The lining of your esophagus, stomach, and duodenum will be examined.  Your health care provider may: ? Take a tissue sample so it can be looked at in a lab (biopsy). ? Remove growths. ? Remove objects (foreign bodies) that are stuck. ? Treat any bleeding with medicines or other devices that stop  tissue from bleeding. ? Widen (dilate) or stretch narrowed areas of your esophagus and stomach.  The endoscope will be taken out. The procedure may vary among health care providers and hospitals. What happens after the procedure?  Your blood pressure, heart rate, breathing rate, and blood oxygen level will be monitored often until the medicines you were given have worn off.  Do not eat or drink anything until the numbing medicine has worn off and your gag reflex has returned. This information is not intended to replace advice given to you by your health care provider. Make sure you discuss any questions you have with your health care provider. Document Released: 09/03/2004 Document Revised: 10/09/2015 Document Reviewed: 03/27/2015 Elsevier Interactive Patient Education  [employer].

## 2018-02-23 NOTE — Op Note (Signed)
[facility] Patient Name: Kelly Watkins Procedure Date: 02/23/2018 7:23 AM MRN: [id number] Date of Birth: 11-11-1955 Attending MD: Hildred Laser , MD CSN: [id number] Age: 62 Admit Type: Outpatient Procedure:                Upper GI endoscopy Indications:              Follow-up of gastro-esophageal reflux disease.                            Pre-op evaluation. Providers:                Hildred Laser, MD, Jeanann Lewandowsky. Sharon Seller, RN, Nelma Rothman, Technician Referring MD:             Ralene Ok, MD and Halford Chessman, MD (PCP). Medicines:                Lidocaine spray, Meperidine 50 mg IV, Midazolam 6                            mg IV Complications:            No immediate complications. Estimated Blood Loss:     Estimated blood loss: none. Procedure:                Pre-Anesthesia Assessment:                           - Prior to the procedure, a History and Physical                            was performed, and patient medications and                            allergies were reviewed. The patient's tolerance of                            previous anesthesia was also reviewed. The risks                            and benefits of the procedure and the sedation                            options and risks were discussed with the patient.                            All questions were answered, and informed consent                            was obtained. Prior Anticoagulants: The patient has                            taken no previous anticoagulant or antiplatelet  agents. ASA Grade Assessment: II - A patient with                            mild systemic disease. After reviewing the risks                            and benefits, the patient was deemed in                            satisfactory condition to undergo the procedure.                           After obtaining informed consent, the endoscope was   passed under direct vision. Throughout the                            procedure, the patient's blood pressure, pulse, and                            oxygen saturations were monitored continuously. The                            [id number]-H190 ([id number]) scope was introduced through the                            mouth, and advanced to the second part of duodenum.                            The upper GI endoscopy was accomplished without                            difficulty. The patient tolerated the procedure                            well. Scope In: 7:44:08 AM Scope Out: 7:49:23 AM Total Procedure Duration: 0 hours 5 minutes 15 seconds  Findings:      The examined esophagus was normal. Tortuous distal segment.      The Z-line was regular and was found 30 cm from the incisors.      A 6 cm hiatal hernia was present.      Multiple 3 to 8 mm pedunculated and sessile polyps with no stigmata of       recent bleeding were found in the gastric fundus and in the gastric body.      A few no bleeding angioectasias were found in the prepyloric region of       the stomach.      The exam of the stomach was otherwise normal.      The duodenal bulb and second portion of the duodenum were normal. Impression:               - Normal esophagus. Tortuous distal segment due to                            presence of hernia.                           -  Z-line regular, 30 cm from the incisors.                           - 6 cm hiatal hernia (moderate size).                           - Multiple gastric polyps.                           - A few non-bleeding telngiectasia in prepyloric                            region of the stomach.                           - Normal duodenal bulb and second portion of the                            duodenum.                           - No specimens collected. Moderate Sedation:      Moderate (conscious) sedation was administered by the endoscopy nurse       and supervised by the  endoscopist. The following parameters were       monitored: oxygen saturation, heart rate, blood pressure, CO2       capnography and response to care. Total physician intraservice time was       11 minutes. Recommendation:           - Patient has a contact number available for                            emergencies. The signs and symptoms of potential                            delayed complications were discussed with the                            patient. Return to normal activities tomorrow.                            Written discharge instructions were provided to the                            patient.                           - Resume previous diet today.                           - Continue present medications.                           - Follow-up with Dr. Rosendo Gros as planned. Procedure Code(s):        --- Professional ---  83662, Esophagogastroduodenoscopy, flexible,                            transoral; diagnostic, including collection of                            specimen(s) by brushing or washing, when performed                            (separate procedure)                           G0500, Moderate sedation services provided by the                            same physician or other qualified health care                            professional performing a gastrointestinal                            endoscopic service that sedation supports,                            requiring the presence of an independent trained                            observer to assist in the monitoring of the                            patient's level of consciousness and physiological                            status; initial 15 minutes of intra-service time;                            patient age 64 years or older (additional time may                            be reported with [phone], as appropriate) Diagnosis Code(s):        --- Professional ---                            K44.9, Diaphragmatic hernia without obstruction or                            gangrene                           K31.7, Polyp of stomach and duodenum                           K31.819, Angiodysplasia of stomach and duodenum                            without bleeding  K21.9, Gastro-esophageal reflux disease without                            esophagitis CPT copyright 2018 [employer]. All rights reserved. The codes documented in this report are preliminary and upon coder review may  be revised to meet current compliance requirements. Hildred Laser, MD Hildred Laser, MD 02/23/2018 8:02:34 AM This report has been signed electronically. Number of Addenda: 0

## 2018-03-01 ENCOUNTER — Encounter (HOSPITAL_COMMUNITY): Payer: Self-pay | Admitting: Internal Medicine

## 2018-03-10 DIAGNOSIS — Z6831 Body mass index (BMI) 31.0-31.9, adult: Secondary | ICD-10-CM | POA: Diagnosis not present

## 2018-03-10 DIAGNOSIS — Z01419 Encounter for gynecological examination (general) (routine) without abnormal findings: Secondary | ICD-10-CM | POA: Diagnosis not present

## 2018-03-10 DIAGNOSIS — Z1231 Encounter for screening mammogram for malignant neoplasm of breast: Secondary | ICD-10-CM | POA: Diagnosis not present

## 2018-03-10 DIAGNOSIS — Z13820 Encounter for screening for osteoporosis: Secondary | ICD-10-CM | POA: Diagnosis not present

## 2018-04-05 DIAGNOSIS — E6609 Other obesity due to excess calories: Secondary | ICD-10-CM | POA: Diagnosis not present

## 2018-04-05 DIAGNOSIS — Z1389 Encounter for screening for other disorder: Secondary | ICD-10-CM | POA: Diagnosis not present

## 2018-04-05 DIAGNOSIS — J22 Unspecified acute lower respiratory infection: Secondary | ICD-10-CM | POA: Diagnosis not present

## 2018-04-05 DIAGNOSIS — I10 Essential (primary) hypertension: Secondary | ICD-10-CM | POA: Diagnosis not present

## 2018-04-05 DIAGNOSIS — Z6831 Body mass index (BMI) 31.0-31.9, adult: Secondary | ICD-10-CM | POA: Diagnosis not present

## 2018-04-06 NOTE — Patient Instructions (Signed)
Kelly Watkins  1955-11-14    Your procedure is scheduled on:  04-19-2018   Report to [facility] Main  Entrance,  Report to admitting at  9:30 AM    Call this number if you have problems the morning of surgery [phone]       Remember: Do not eat food or drink liquids :After Midnight.                                      BRUSH YOUR TEETH MORNING OF SURGERY AND RINSE YOUR MOUTH OUT, NO CHEWING GUM CANDY OR MINTS.     Take these medicines the morning of surgery with A SIP OF WATER:  Cetirizine (zyrtec), Omeprazole(prilosec                   You may not have any metal on your body including hair pins and               piercings  Do not wear jewelry, make-up, lotions, powders or perfumes, deodorant              Do not wear nail polish.  Do not shave  48 hours prior to surgery.                  Do not bring valuables to the hospital. [place].  Contacts, dentures or bridgework may not be worn into surgery.  Leave suitcase in the car. After surgery it may be brought to your room.     _____________________________________________________________________             [facility] - Preparing for Surgery Before surgery, you can play an important role.  Because skin is not sterile, your skin needs to be as free of germs as possible.  You can reduce the number of germs on your skin by washing with CHG (chlorahexidine gluconate) soap before surgery.  CHG is an antiseptic cleaner which kills germs and bonds with the skin to continue killing germs even after washing. Please DO NOT use if you have an allergy to CHG or antibacterial soaps.  If your skin becomes reddened/irritated stop using the CHG and inform your nurse when you arrive at Short Stay. Do not shave (including legs and underarms) for at least 48 hours prior to the first CHG shower.  You may shave your face/neck. Please follow these  instructions carefully:  1.  Shower with CHG Soap the night before surgery and the  morning of Surgery.  2.  If you choose to wash your hair, wash your hair first as usual with your  normal  shampoo.  3.  After you shampoo, rinse your hair and body thoroughly to remove the  shampoo.                            4.  Use CHG as you would any other liquid soap.  You can apply chg directly  to the skin and wash                       Gently with a scrungie or clean washcloth.  5.  Apply  the CHG Soap to your body ONLY FROM THE NECK DOWN.   Do not use on face/ open                           Wound or open sores. Avoid contact with eyes, ears mouth and genitals (private parts).                       Wash face,  Genitals (private parts) with your normal soap.             6.  Wash thoroughly, paying special attention to the area where your surgery  will be performed.  7.  Thoroughly rinse your body with warm water from the neck down.  8.  DO NOT shower/wash with your normal soap after using and rinsing off  the CHG Soap.             9.  Pat yourself dry with a clean towel.            10.  Wear clean pajamas.            11.  Place clean sheets on your bed the night of your first shower and do not  sleep with pets. Day of Surgery : Do not apply any lotions/deodorants the morning of surgery.  Please wear clean clothes to the hospital/surgery center.  FAILURE TO FOLLOW THESE INSTRUCTIONS MAY RESULT IN THE CANCELLATION OF YOUR SURGERY PATIENT SIGNATURE_________________________________  NURSE SIGNATURE__________________________________  ________________________________________________________________________

## 2018-04-07 ENCOUNTER — Other Ambulatory Visit: Payer: Self-pay

## 2018-04-07 ENCOUNTER — Encounter (HOSPITAL_COMMUNITY)
Admission: RE | Admit: 2018-04-07 | Discharge: 2018-04-07 | Disposition: A | Discharge status: Home / Self Care | Payer: BLUE CROSS/BLUE SHIELD | Source: Ambulatory Visit | Attending: General Surgery | Admitting: General Surgery

## 2018-04-07 ENCOUNTER — Encounter (HOSPITAL_COMMUNITY): Payer: Self-pay

## 2018-04-07 DIAGNOSIS — K449 Diaphragmatic hernia without obstruction or gangrene: Secondary | ICD-10-CM | POA: Diagnosis not present

## 2018-04-07 DIAGNOSIS — R9431 Abnormal electrocardiogram [ECG] [EKG]: Secondary | ICD-10-CM | POA: Insufficient documentation

## 2018-04-07 DIAGNOSIS — Z01818 Encounter for other preprocedural examination: Secondary | ICD-10-CM | POA: Insufficient documentation

## 2018-04-07 HISTORY — DX: Iron deficiency anemia, unspecified: D50.9

## 2018-04-07 HISTORY — DX: Diaphragmatic hernia without obstruction or gangrene: K44.9

## 2018-04-07 HISTORY — DX: Prediabetes: R73.03

## 2018-04-07 HISTORY — DX: Esophageal obstruction: K22.2

## 2018-04-07 HISTORY — DX: Cough: R05

## 2018-04-07 LAB — BASIC METABOLIC PANEL
Anion gap: 9 (ref 5–15)
BUN: 20 mg/dL (ref 8–23)
CALCIUM: 8.8 mg/dL — AB (ref 8.9–10.3)
CO2: 27 mmol/L (ref 22–32)
Chloride: 107 mmol/L (ref 98–111)
Creatinine, Ser: 0.95 mg/dL (ref 0.44–1.00)
Glucose, Bld: 142 mg/dL — ABNORMAL HIGH (ref 70–99)
POTASSIUM: 3.2 mmol/L — AB (ref 3.5–5.1)
SODIUM: 143 mmol/L (ref 135–145)

## 2018-04-07 LAB — CBC
HCT: 41.3 % (ref 36.0–46.0)
Hemoglobin: 12.9 g/dL (ref 12.0–15.0)
MCH: 28.1 pg (ref 26.0–34.0)
MCHC: 31.2 g/dL (ref 30.0–36.0)
MCV: 90 fL (ref 80.0–100.0)
PLATELETS: 345 10*3/uL (ref 150–400)
RBC: 4.59 MIL/uL (ref 3.87–5.11)
RDW: 12.2 % (ref 11.5–15.5)
WBC: 8.6 10*3/uL (ref 4.0–10.5)
nRBC: 0 % (ref 0.0–0.2)

## 2018-04-07 LAB — HEMOGLOBIN A1C
Hgb A1c MFr Bld: 6 % — ABNORMAL HIGH (ref 4.8–5.6)
Mean Plasma Glucose: 125.5 mg/dL

## 2018-04-07 MED ORDER — CHLORHEXIDINE GLUCONATE CLOTH 2 % EX PADS
6.0000 | MEDICATED_PAD | Freq: Once | CUTANEOUS | Status: DC
  Filled 2018-04-07: qty 6

## 2018-04-07 NOTE — Progress Notes (Addendum)
BMP result dated 04-07-2018 routed to dr Rosendo Gros in epic.  ADDENDUM:  Final EKG dated 04-07-2018 in epic.

## 2018-04-19 ENCOUNTER — Encounter (HOSPITAL_COMMUNITY)
Admission: RE | Disposition: A | Discharge status: Home / Self Care | Payer: Self-pay | Source: Ambulatory Visit | Attending: General Surgery

## 2018-04-19 ENCOUNTER — Ambulatory Visit (HOSPITAL_COMMUNITY): Payer: BLUE CROSS/BLUE SHIELD | Admitting: Certified Registered Nurse Anesthetist

## 2018-04-19 ENCOUNTER — Encounter (HOSPITAL_COMMUNITY): Payer: Self-pay | Admitting: Certified Registered Nurse Anesthetist

## 2018-04-19 ENCOUNTER — Observation Stay (HOSPITAL_COMMUNITY)
Admission: RE | Admit: 2018-04-19 | Discharge: 2018-04-20 | Disposition: A | Discharge status: Home / Self Care | Payer: BLUE CROSS/BLUE SHIELD | Source: Ambulatory Visit | Attending: General Surgery | Admitting: General Surgery

## 2018-04-19 ENCOUNTER — Other Ambulatory Visit: Payer: Self-pay

## 2018-04-19 DIAGNOSIS — Z880 Allergy status to penicillin: Secondary | ICD-10-CM | POA: Diagnosis not present

## 2018-04-19 DIAGNOSIS — I10 Essential (primary) hypertension: Secondary | ICD-10-CM | POA: Diagnosis not present

## 2018-04-19 DIAGNOSIS — Z882 Allergy status to sulfonamides status: Secondary | ICD-10-CM | POA: Insufficient documentation

## 2018-04-19 DIAGNOSIS — Z8719 Personal history of other diseases of the digestive system: Secondary | ICD-10-CM

## 2018-04-19 DIAGNOSIS — K449 Diaphragmatic hernia without obstruction or gangrene: Principal | ICD-10-CM | POA: Insufficient documentation

## 2018-04-19 DIAGNOSIS — Z79899 Other long term (current) drug therapy: Secondary | ICD-10-CM | POA: Insufficient documentation

## 2018-04-19 DIAGNOSIS — K317 Polyp of stomach and duodenum: Secondary | ICD-10-CM | POA: Insufficient documentation

## 2018-04-19 DIAGNOSIS — K219 Gastro-esophageal reflux disease without esophagitis: Secondary | ICD-10-CM | POA: Insufficient documentation

## 2018-04-19 HISTORY — PX: INSERTION OF MESH: SHX5868

## 2018-04-19 LAB — GLUCOSE, CAPILLARY: Glucose-Capillary: 87 mg/dL (ref 70–99)

## 2018-04-19 SURGERY — FUNDOPLICATION, NISSEN, ROBOT-ASSISTED, LAPAROSCOPIC
Anesthesia: General

## 2018-04-19 MED ORDER — MIDAZOLAM HCL 2 MG/2ML IJ SOLN
INTRAMUSCULAR | Status: AC
  Filled 2018-04-19: qty 2

## 2018-04-19 MED ORDER — ROCURONIUM BROMIDE 10 MG/ML (PF) SYRINGE
PREFILLED_SYRINGE | INTRAVENOUS | Status: DC | PRN
  Administered 2018-04-19: 20 mg via INTRAVENOUS
  Administered 2018-04-19: 50 mg via INTRAVENOUS

## 2018-04-19 MED ORDER — KETAMINE HCL 10 MG/ML IJ SOLN
INTRAMUSCULAR | Status: DC | PRN
  Administered 2018-04-19: 40 mg via INTRAVENOUS

## 2018-04-19 MED ORDER — LACTATED RINGERS IV SOLN
INTRAVENOUS | Status: DC
  Administered 2018-04-19: 10:00:00 via INTRAVENOUS

## 2018-04-19 MED ORDER — LOSARTAN POTASSIUM 50 MG PO TABS
100.0000 mg | ORAL_TABLET | Freq: Every morning | ORAL | Status: DC
  Administered 2018-04-19 – 2018-04-20 (×2): 100 mg via ORAL
  Filled 2018-04-19 (×2): qty 2

## 2018-04-19 MED ORDER — GABAPENTIN 300 MG PO CAPS
300.0000 mg | ORAL_CAPSULE | ORAL | Status: AC
  Administered 2018-04-19: 300 mg via ORAL
  Filled 2018-04-19: qty 1

## 2018-04-19 MED ORDER — LIDOCAINE 2% (20 MG/ML) 5 ML SYRINGE
INTRAMUSCULAR | Status: AC
  Filled 2018-04-19: qty 10

## 2018-04-19 MED ORDER — PROPOFOL 10 MG/ML IV BOLUS
INTRAVENOUS | Status: AC
  Filled 2018-04-19: qty 20

## 2018-04-19 MED ORDER — ROCURONIUM BROMIDE 100 MG/10ML IV SOLN
INTRAVENOUS | Status: AC
  Filled 2018-04-19: qty 1

## 2018-04-19 MED ORDER — ONDANSETRON HCL 4 MG/2ML IJ SOLN
INTRAMUSCULAR | Status: AC
  Filled 2018-04-19: qty 2

## 2018-04-19 MED ORDER — HYDROMORPHONE HCL 1 MG/ML IJ SOLN
1.0000 mg | INTRAMUSCULAR | Status: DC | PRN
  Administered 2018-04-20: 1 mg via INTRAVENOUS
  Filled 2018-04-19: qty 1

## 2018-04-19 MED ORDER — ONDANSETRON HCL 4 MG/2ML IJ SOLN: 4.0000 mg | Freq: Four times a day (QID) | INTRAMUSCULAR | Status: DC | PRN

## 2018-04-19 MED ORDER — SUGAMMADEX SODIUM 200 MG/2ML IV SOLN
INTRAVENOUS | Status: DC | PRN
  Administered 2018-04-19: 200 mg via INTRAVENOUS

## 2018-04-19 MED ORDER — MEPERIDINE HCL 50 MG/ML IJ SOLN: 6.2500 mg | INTRAMUSCULAR | Status: DC | PRN

## 2018-04-19 MED ORDER — CELECOXIB 200 MG PO CAPS
200.0000 mg | ORAL_CAPSULE | ORAL | Status: AC
  Administered 2018-04-19: 200 mg via ORAL
  Filled 2018-04-19: qty 1

## 2018-04-19 MED ORDER — ONDANSETRON HCL 4 MG/2ML IJ SOLN
INTRAMUSCULAR | Status: DC | PRN
  Administered 2018-04-19: 4 mg via INTRAVENOUS

## 2018-04-19 MED ORDER — LIDOCAINE HCL 2 % IJ SOLN
INTRAMUSCULAR | Status: AC
  Filled 2018-04-19: qty 40

## 2018-04-19 MED ORDER — SUGAMMADEX SODIUM 200 MG/2ML IV SOLN
INTRAVENOUS | Status: AC
  Filled 2018-04-19: qty 2

## 2018-04-19 MED ORDER — KETAMINE HCL 10 MG/ML IJ SOLN
INTRAMUSCULAR | Status: AC
  Filled 2018-04-19: qty 1

## 2018-04-19 MED ORDER — ACETAMINOPHEN 500 MG PO TABS
1000.0000 mg | ORAL_TABLET | ORAL | Status: AC
  Administered 2018-04-19: 1000 mg via ORAL
  Filled 2018-04-19: qty 2

## 2018-04-19 MED ORDER — KETOROLAC TROMETHAMINE 30 MG/ML IJ SOLN: 30.0000 mg | Freq: Once | INTRAMUSCULAR | Status: DC | PRN

## 2018-04-19 MED ORDER — LIDOCAINE 2% (20 MG/ML) 5 ML SYRINGE
INTRAMUSCULAR | Status: DC | PRN
  Administered 2018-04-19: 1.5 mg/kg/h via INTRAVENOUS

## 2018-04-19 MED ORDER — HYDROMORPHONE HCL 1 MG/ML IJ SOLN
INTRAMUSCULAR | Status: AC
  Filled 2018-04-19: qty 1

## 2018-04-19 MED ORDER — FENTANYL CITRATE (PF) 250 MCG/5ML IJ SOLN
INTRAMUSCULAR | Status: AC
  Filled 2018-04-19: qty 5

## 2018-04-19 MED ORDER — LACTATED RINGERS IR SOLN
Status: DC | PRN
  Administered 2018-04-19: 1000 mL

## 2018-04-19 MED ORDER — FENTANYL CITRATE (PF) 250 MCG/5ML IJ SOLN
INTRAMUSCULAR | Status: DC | PRN
  Administered 2018-04-19: 100 ug via INTRAVENOUS
  Administered 2018-04-19 (×3): 50 ug via INTRAVENOUS

## 2018-04-19 MED ORDER — ONDANSETRON 4 MG PO TBDP
4.0000 mg | ORAL_TABLET | Freq: Four times a day (QID) | ORAL | Status: DC | PRN
  Administered 2018-04-19: 4 mg via ORAL
  Filled 2018-04-19: qty 1

## 2018-04-19 MED ORDER — SUCCINYLCHOLINE CHLORIDE 200 MG/10ML IV SOSY
PREFILLED_SYRINGE | INTRAVENOUS | Status: DC | PRN
  Administered 2018-04-19: 120 mg via INTRAVENOUS

## 2018-04-19 MED ORDER — FUROSEMIDE 20 MG PO TABS: 20.0000 mg | ORAL_TABLET | ORAL | Status: DC | PRN

## 2018-04-19 MED ORDER — HYDROMORPHONE HCL 1 MG/ML IJ SOLN
0.2500 mg | INTRAMUSCULAR | Status: DC | PRN
  Administered 2018-04-19 (×4): 0.5 mg via INTRAVENOUS

## 2018-04-19 MED ORDER — CEFAZOLIN SODIUM-DEXTROSE 2-4 GM/100ML-% IV SOLN
2.0000 g | INTRAVENOUS | Status: AC
  Administered 2018-04-19: 2 g via INTRAVENOUS
  Filled 2018-04-19: qty 100

## 2018-04-19 MED ORDER — BUPIVACAINE-EPINEPHRINE 0.25% -1:200000 IJ SOLN
INTRAMUSCULAR | Status: DC | PRN
  Administered 2018-04-19: 15 mL

## 2018-04-19 MED ORDER — DEXAMETHASONE SODIUM PHOSPHATE 10 MG/ML IJ SOLN
INTRAMUSCULAR | Status: DC | PRN
  Administered 2018-04-19: 5 mg via INTRAVENOUS

## 2018-04-19 MED ORDER — EPHEDRINE SULFATE-NACL 50-0.9 MG/10ML-% IV SOSY
PREFILLED_SYRINGE | INTRAVENOUS | Status: DC | PRN
  Administered 2018-04-19: 5 mg via INTRAVENOUS

## 2018-04-19 MED ORDER — ENOXAPARIN SODIUM 40 MG/0.4ML ~~LOC~~ SOLN
40.0000 mg | SUBCUTANEOUS | Status: DC
  Administered 2018-04-20: 40 mg via SUBCUTANEOUS
  Filled 2018-04-19: qty 0.4

## 2018-04-19 MED ORDER — BUPIVACAINE-EPINEPHRINE (PF) 0.25% -1:200000 IJ SOLN
INTRAMUSCULAR | Status: AC
  Filled 2018-04-19: qty 30

## 2018-04-19 MED ORDER — 0.9 % SODIUM CHLORIDE (POUR BTL) OPTIME
TOPICAL | Status: DC | PRN
  Administered 2018-04-19: 1000 mL

## 2018-04-19 MED ORDER — DEXAMETHASONE SODIUM PHOSPHATE 10 MG/ML IJ SOLN
INTRAMUSCULAR | Status: AC
  Filled 2018-04-19: qty 1

## 2018-04-19 MED ORDER — MIDAZOLAM HCL 5 MG/5ML IJ SOLN
INTRAMUSCULAR | Status: DC | PRN
  Administered 2018-04-19: 2 mg via INTRAVENOUS

## 2018-04-19 MED ORDER — LIDOCAINE 2% (20 MG/ML) 5 ML SYRINGE
INTRAMUSCULAR | Status: DC | PRN
  Administered 2018-04-19: 50 mg via INTRAVENOUS

## 2018-04-19 MED ORDER — INFLUENZA VAC SPLIT QUAD 0.5 ML IM SUSY: 0.5000 mL | PREFILLED_SYRINGE | INTRAMUSCULAR | Status: DC

## 2018-04-19 MED ORDER — SUCCINYLCHOLINE CHLORIDE 200 MG/10ML IV SOSY
PREFILLED_SYRINGE | INTRAVENOUS | Status: AC
  Filled 2018-04-19: qty 10

## 2018-04-19 MED ORDER — EPHEDRINE 5 MG/ML INJ
INTRAVENOUS | Status: AC
  Filled 2018-04-19: qty 10

## 2018-04-19 MED ORDER — PROPOFOL 10 MG/ML IV BOLUS
INTRAVENOUS | Status: DC | PRN
  Administered 2018-04-19: 140 mg via INTRAVENOUS

## 2018-04-19 MED ORDER — DEXTROSE-NACL 5-0.9 % IV SOLN
INTRAVENOUS | Status: DC
  Administered 2018-04-19 – 2018-04-20 (×2): via INTRAVENOUS

## 2018-04-19 MED ORDER — PROMETHAZINE HCL 25 MG/ML IJ SOLN: 6.2500 mg | INTRAMUSCULAR | Status: DC | PRN

## 2018-04-19 SURGICAL SUPPLY — 64 items
ADH SKN CLS APL DERMABOND .7 (GAUZE/BANDAGES/DRESSINGS) ×1
APPLIER CLIP 5 13 M/L LIGAMAX5 (MISCELLANEOUS)
APPLIER CLIP ROT 10 11.4 M/L (STAPLE)
APR CLP MED LRG 11.4X10 (STAPLE)
APR CLP MED LRG 5 ANG JAW (MISCELLANEOUS)
BLADE SURG SZ11 CARB STEEL (BLADE) ×2 IMPLANT
CHLORAPREP W/TINT 26ML (MISCELLANEOUS) ×2 IMPLANT
CLIP APPLIE 5 13 M/L LIGAMAX5 (MISCELLANEOUS) IMPLANT
CLIP APPLIE ROT 10 11.4 M/L (STAPLE) IMPLANT
CLIP VESOLOCK LG 6/CT PURPLE (CLIP) IMPLANT
CLIP VESOLOCK MED LG 6/CT (CLIP) IMPLANT
COVER SURGICAL LIGHT HANDLE (MISCELLANEOUS) ×2 IMPLANT
COVER TIP SHEARS 8 DVNC (MISCELLANEOUS) IMPLANT
COVER TIP SHEARS 8MM DA VINCI (MISCELLANEOUS) ×1
DERMABOND ADVANCED (GAUZE/BANDAGES/DRESSINGS) ×1
DERMABOND ADVANCED .7 DNX12 (GAUZE/BANDAGES/DRESSINGS) ×1 IMPLANT
DRAIN PENROSE .75X.25X12 SILI (WOUND CARE) ×1 IMPLANT
DRAPE ARM DVNC X/XI (DISPOSABLE) ×4 IMPLANT
DRAPE COLUMN DVNC XI (DISPOSABLE) ×1 IMPLANT
DRAPE DA VINCI XI ARM (DISPOSABLE) ×4
DRAPE DA VINCI XI COLUMN (DISPOSABLE) ×1
ELECT REM PT RETURN 15FT ADLT (MISCELLANEOUS) ×2 IMPLANT
ENDOLOOP SUT PDS II  0 18 (SUTURE)
ENDOLOOP SUT PDS II 0 18 (SUTURE) IMPLANT
GAUZE 4X4 16PLY RFD (DISPOSABLE) ×2 IMPLANT
GLOVE BIOGEL PI IND STRL 7.0 (GLOVE) IMPLANT
GLOVE BIOGEL PI IND STRL 7.5 (GLOVE) IMPLANT
GLOVE BIOGEL PI IND STRL 8 (GLOVE) IMPLANT
GLOVE BIOGEL PI INDICATOR 7.0 (GLOVE) ×6
GLOVE BIOGEL PI INDICATOR 7.5 (GLOVE) ×2
GLOVE BIOGEL PI INDICATOR 8 (GLOVE) ×1
GLOVE SURG SS PI 7.5 STRL IVOR (GLOVE) ×2 IMPLANT
GLOVE SURG SS PI 8.0 STRL IVOR (GLOVE) ×1 IMPLANT
GOWN STRL REUS W/TWL XL LVL3 (GOWN DISPOSABLE) ×10 IMPLANT
KIT BASIN OR (CUSTOM PROCEDURE TRAY) ×2 IMPLANT
MARKER SKIN DUAL TIP RULER LAB (MISCELLANEOUS) ×2 IMPLANT
MESH BIO-A 7X10 SYN MAT (Mesh General) ×1 IMPLANT
NDL INSUFFLATION 14GA 120MM (NEEDLE) ×1 IMPLANT
NEEDLE HYPO 22GX1.5 SAFETY (NEEDLE) ×2 IMPLANT
NEEDLE INSUFFLATION 14GA 120MM (NEEDLE) ×2 IMPLANT
OBTURATOR OPTICAL STANDARD 8MM (TROCAR)
OBTURATOR OPTICAL STND 8 DVNC (TROCAR)
OBTURATOR OPTICALSTD 8 DVNC (TROCAR) IMPLANT
PACK CARDIOVASCULAR III (CUSTOM PROCEDURE TRAY) ×2 IMPLANT
SCISSORS LAP 5X35 DISP (ENDOMECHANICALS) ×2 IMPLANT
SEAL CANN UNIV 5-8 DVNC XI (MISCELLANEOUS) ×4 IMPLANT
SEAL XI 5MM-8MM UNIVERSAL (MISCELLANEOUS) ×4
SEALER VESSEL DA VINCI XI (MISCELLANEOUS) ×1
SEALER VESSEL EXT DVNC XI (MISCELLANEOUS) ×1 IMPLANT
SET IRRIG TUBING LAPAROSCOPIC (IRRIGATION / IRRIGATOR) ×2 IMPLANT
SOLUTION ANTI FOG 6CC (MISCELLANEOUS) ×2 IMPLANT
SOLUTION ELECTROLUBE (MISCELLANEOUS) ×2 IMPLANT
STAPLER VISISTAT 35W (STAPLE) IMPLANT
SUT ETHIBOND 0 36 GRN (SUTURE) ×4 IMPLANT
SUT MNCRL AB 4-0 PS2 18 (SUTURE) ×2 IMPLANT
SUT SILK 0 SH 30 (SUTURE) ×3 IMPLANT
SUT SILK 2 0 SH (SUTURE) ×4 IMPLANT
SYR 20CC LL (SYRINGE) ×2 IMPLANT
TOWEL OR 17X26 10 PK STRL BLUE (TOWEL DISPOSABLE) ×2 IMPLANT
TOWEL OR NON WOVEN STRL DISP B (DISPOSABLE) ×2 IMPLANT
TRAY FOL W/BAG SLVR 16FR STRL (SET/KITS/TRAYS/PACK) IMPLANT
TRAY FOLEY W/BAG SLVR 16FR LF (SET/KITS/TRAYS/PACK) ×2
TROCAR ADV FIXATION 5X100MM (TROCAR) ×2 IMPLANT
TUBING INSUFFLATION (TUBING) ×2 IMPLANT

## 2018-04-19 NOTE — Anesthesia Postprocedure Evaluation (Signed)
Anesthesia Post Note  Patient: Kelly Watkins  Procedure(s) Performed: XI ROBOTIC HIATAL HERNIA REPAIR WITH MESH AND FUNDOPLICATION (N/A ) INSERTION OF MESH (N/A )     Patient location during evaluation: PACU Anesthesia Type: General Level of consciousness: awake Pain management: pain level controlled Vital Signs Assessment: post-procedure vital signs reviewed and stable Respiratory status: spontaneous breathing Cardiovascular status: stable Postop Assessment: no apparent nausea or vomiting Anesthetic complications: no    Last Vitals:  Vitals:   04/19/18 1400 04/19/18 1415  BP: (!) 156/82 121/73  Pulse: 82 81  Resp:  16  Temp:  36.7 C  SpO2: 100% 93%    Last Pain:  Vitals:   04/19/18 1415  TempSrc:   PainSc: 4    Pain Goal: Patients Stated Pain Goal: 4 (04/19/18 1025)               Huston Foley

## 2018-04-19 NOTE — Op Note (Signed)
04/19/2018  1:00 PM  PATIENT:  Kelly Watkins  62 y.o. female  PRE-OPERATIVE DIAGNOSIS:  HIATAL HERNIA  POST-OPERATIVE DIAGNOSIS:  HIATAL HERNIA  PROCEDURE:  Procedure(s): XI ROBOTIC HIATAL HERNIA REPAIR WITH MESH AND TOUPET FUNDOPLICATION (N/A) INSERTION OF MESH (N/A)  SURGEON:  Surgeon(s) and Role:    * Ralene Ok, MD - Primary    Michael Boston, MD - Assisting  ANESTHESIA:   local and general  EBL:  minimal   BLOOD ADMINISTERED:none  DRAINS: none   LOCAL MEDICATIONS USED:  BUPIVICAINE   SPECIMEN:  No Specimen  DISPOSITION OF SPECIMEN:  N/A  COUNTS:  YES  TOURNIQUET:  * No tourniquets in log *  DICTATION: .Dragon Dictation The patient was taken back to the operating room and placed in the supine position with bilateral SCDs in place. The patient was prepped and draped in the usual sterile fashion. After appropriate antibiotics were confirmed a timeout was called and all facts were verified.   A Veress needle technique was used to insufflate the abdomen to 15 mm of mercury the paramedian stab incision. Subsequent to this an 8 mm trocar was introduced as was a 8 millimeter camera. At this time the subsequent robotic trochars x3, were then placed adjacent to this trocar approximately 8-10 cm away. Each trocar was inserted under direct visualization, there were total of 4 trochars. The assistant trocar was then placed in the right upper quadrant under direct visualization. The Nathanson retractor was then visualized inserted into the abdomen and the incision just to the left of the falciform ligament. This was then placed to retract the liver appropriately. At this time the patient was positioned in reverse Trendelenburg.   At this time the robot patient cart was brought to the bedside and placed in good position and the arms were docked to the trochars appropriately. At this time I proceeded to incise the gastrohepatic ligament.  At this time I proceeded to mobilize the  stomach inferiorly and visualize the right crus. The peritoneum over the right crus was incised and right crus was identified. I proceeded to dissect this inferiorly until the left crus was seen joining the right crus. Once the right crus was adequately dissected we turned our to the left crus which was dissected away. This required traction of the stomach to the right side. Once this was visualized we then proceeded to circumferentially dissect the esophagus away from the surrounding tissue.  At this time the phrenoesophageal fat pad was dissected away from the esophagus. There was a moderate-sized hiatal hernia seen. I mobilized the esophagus cephalad approximately 4-5 cm, clearing away the surrounding tissue. The anterior hernia sac was dissected away from the stomach and esophagus and discarded.   At this time we turned our attention to the greater curvature the stomach and the omentum was mobilized using the robotic vessel sealer. This was taken up to the greater curvature to the hiatus. This mobilized the entire greater curvature to allow mobilization and the wrap. I then proceeded to bring the greater curvature the stomach posterior to the esophagus, and a shoeshine technique was used to evaluate the mobilization of the greater curvature.   At this time I proceeded to close the hiatus using figure of 8 0 Ethibonds x 3. This brought together the hiatal closure without undue stricture to the esophagus.   A piece of Gore Bio A hiatal mesh was placed over the hiatal closure and sutured to the crus using 2-0 silk sutures x  4.  At this time the greater curvature was brought around the esophagus and sutured using 0 silk sutures interrupted fashion approximately 1 cm apart x3 on both the right side and left side of the esophagus in a Toupet wrap fashion. The wrap lay at approximately 11:00 on its own with undue tension.  The wrap lay loose with no strangulation of the esophagus.  At this time the robot was  undocked. The liver trocar was removed. At this time insufflation was evacuated. Skin was reapproximated with a 4-0 Monocryl subcuticular fashion. The skin was then dressed with Dermabond. The patient tolerated the procedure well and was taken to the recovery room in stable condition.    PLAN OF CARE: Admit for overnight observation  PATIENT DISPOSITION:  PACU - hemodynamically stable.   Delay start of Pharmacological VTE agent (>24hrs) due to surgical blood loss or risk of bleeding: yes

## 2018-04-19 NOTE — Anesthesia Procedure Notes (Signed)
Procedure Name: Intubation Date/Time: 04/19/2018 10:59 AM Performed by: [place] Larene Ascencio C, CRNA Pre-anesthesia Checklist: Patient identified, Emergency Drugs available, Suction available and Patient being monitored Patient Re-evaluated:Patient Re-evaluated prior to induction Oxygen Delivery Method: Circle system utilized Preoxygenation: Pre-oxygenation with 100% oxygen Induction Type: IV induction and Rapid sequence Laryngoscope Size: Mac and 3 Grade View: Grade I Tube type: Oral Tube size: 7.0 mm Number of attempts: 1 Airway Equipment and Method: Stylet and Oral airway Placement Confirmation: ETT inserted through vocal cords under direct vision,  positive ETCO2 and breath sounds checked- equal and bilateral Secured at: 20 cm Tube secured with: Tape Dental Injury: Teeth and Oropharynx as per pre-operative assessment

## 2018-04-19 NOTE — H&P (Signed)
History of Present Illness  The patient is a 62 year old female who presents with a hiatal hernia. Referred by: Dr. Laural Golden Chief Complaint: Reflux  Patient is a 62 year old female who comes in with a history of hypertension, reflux, hiatal hernia. Patient states that she's had a 20-ish year history of reflux. Patient's been on multiple PPIs during this time. Patient had a previous endoscopy which revealed a small hiatal hernia. Patient was also found to have gastric polyps. I thought by her GI physician was that these were likely due to her Protonix. Patient is concerned about side effects of the Protonix and due to her reflux will like to have reflux surgery and repair were hiatal hernia.  Patient's had a previous laparoscopic hysterectomy.    Past Surgical History Cataract Surgery  Bilateral. Hysterectomy (not due to cancer) - Complete  Oral Surgery   Diagnostic Studies History  Colonoscopy  1-5 years ago Mammogram  within last year Pap Smear  1-5 years ago  Allergies  hydroCHLOROthiazide *DIURETICS*  Itching. Allergies Reconciled   Medication History  Omeprazole (20MG  Capsule DR, Oral) Active. Losartan Potassium (100MG  Tablet, Oral) Active. Medications Reconciled  Social History Caffeine use  Carbonated beverages, Coffee, Tea. No alcohol use  No drug use  Tobacco use  Never smoker.  Family History Cerebrovascular Accident  Mother. Heart Disease  Mother. Heart disease in female family member before age 54  Hypertension  Father, Mother. Melanoma  Mother.  Pregnancy / Birth History (Age at menarche  24 years. Age of menopause  <43 Contraceptive History  Oral contraceptives. Gravida  1 Maternal age  69-30 Para  1  Other Problems  Gastroesophageal Reflux Disease  Hemorrhoids  High blood pressure  Hypercholesterolemia  Oophorectomy  Bilateral. Transfusion history     Review of Systems  General Present-  Fatigue and Weight Gain. Not Present- Appetite Loss, Chills, Fever, Night Sweats and Weight Loss. Skin Present- Dryness. Not Present- Change in Wart/Mole, Hives, Jaundice, New Lesions, Non-Healing Wounds, Rash and Ulcer. HEENT Present- Hoarseness, Seasonal Allergies and Wears glasses/contact lenses. Not Present- Earache, Hearing Loss, Nose Bleed, Oral Ulcers, Ringing in the Ears, Sinus Pain, Sore Throat, Visual Disturbances and Yellow Eyes. Respiratory Present- Snoring. Not Present- Bloody sputum, Chronic Cough, Difficulty Breathing and Wheezing. Breast Not Present- Breast Mass, Breast Pain, Nipple Discharge and Skin Changes. Cardiovascular Present- Leg Cramps and Swelling of Extremities. Not Present- Chest Pain, Difficulty Breathing Lying Down, Palpitations, Rapid Heart Rate and Shortness of Breath. Gastrointestinal Present- Difficulty Swallowing and Hemorrhoids. Not Present- Abdominal Pain, Bloating, Bloody Stool, Change in Bowel Habits, Chronic diarrhea, Constipation, Excessive gas, Gets full quickly at meals, Indigestion, Nausea, Rectal Pain and Vomiting. Musculoskeletal Not Present- Back Pain, Joint Pain, Joint Stiffness, Muscle Pain, Muscle Weakness and Swelling of Extremities. Neurological Not Present- Decreased Memory, Fainting, Headaches, Numbness, Seizures, Tingling, Tremor, Trouble walking and Weakness. Psychiatric Not Present- Anxiety, Bipolar, Change in Sleep Pattern, Depression, Fearful and Frequent crying. Endocrine Present- Cold Intolerance and Excessive Hunger. Not Present- Hair Changes, Heat Intolerance, Hot flashes and New Diabetes. Hematology Not Present- Blood Thinners, Easy Bruising, Excessive bleeding, Gland problems, HIV and Persistent Infections. All other systems negative  BP (!) 165/86   Pulse 72   Temp 98 F (36.7 C) (Oral)   Resp 14   Ht 5' 4.75" (1.645 m)   Wt 83.7 kg   SpO2 96%   BMI 30.94 kg/m     Physical Exam The physical exam findings are as  follows: Note:Constitutional: No acute distress, conversant,  appears stated age  Eyes: Anicteric sclerae, moist conjunctiva, no lid lag  Neck: No thyromegaly, trachea midline, no cervical lymphadenopathy  Lungs: Clear to auscultation biilaterally, normal respiratory effot  Cardiovascular: regular rate & rhythm, no murmurs, no peripheal edema, pedal pulses 2+  GI: Soft, no masses or hepatosplenomegaly, non-tender to palpation  MSK: Normal gait, no clubbing cyanosis, edema  Skin: No rashes, palpation reveals normal skin turgor  Psychiatric: Appropriate judgment and insight, oriented to person, place, and time    Assessment & Plan HIATAL HERNIA (K44.9) Impression: 62 year old female with a history of hypertension, reflux, hiatal hernia Patient undergo endoscopy by Dr. Laural Golden 1. We will obtain a esophagogram to evaluate the hiatal hernia 2. Will proceed to the operating room for a robotic hiatal hernia repair with mesh and Nissen fundoplication  3. Discussed with patient the risks and benefits of the procedure to include but not limited to: Infection, bleeding, damage to structures, possible pneumothorax, possible recurrence. The patient voiced understanding and wishes to proceed.

## 2018-04-19 NOTE — Discharge Instructions (Signed)
EATING AFTER YOUR ESOPHAGEAL SURGERY (Stomach Fundoplication, Hiatal Hernia repair, Achalasia surgery, etc)  ######################################################################  EAT Start with a pureed / full liquid diet (see below) Gradually transition to a high fiber diet with a fiber supplement over the next month after discharge.    WALK Walk an hour a day.  Control your pain to do that.    CONTROL PAIN Control pain so that you can walk, sleep, tolerate sneezing/coughing, go up/down stairs.  HAVE A BOWEL MOVEMENT DAILY Keep your bowels regular to avoid problems.  OK to try a laxative to override constipation.  OK to use an antidairrheal to slow down diarrhea.  Call if not better after 2 tries  CALL IF YOU HAVE PROBLEMS/CONCERNS Call if you are still struggling despite following these instructions. Call if you have concerns not answered by these instructions  ######################################################################   After your esophageal surgery, expect some sticking with swallowing over the next 1-2 months.    If food sticks when you eat, it is called "dysphagia".  This is due to swelling around your esophagus at the wrap & hiatal diaphragm repair.  It will gradually ease off over the next few months.  To help you through this temporary phase, we start you out on a pureed (blenderized) diet.  Your first meal in the hospital was thin liquids.  You should have been given a pureed diet by the time you left the hospital.  We ask patients to stay on a pureed diet for the first 2-3 weeks to avoid anything getting "stuck" near your recent surgery.  Don't be alarmed if your ability to swallow doesn't progress according to this plan.  Everyone is different and some diets can advance more or less quickly.     Some BASIC RULES to follow are:  Maintain an upright position whenever eating or drinking.  Take small bites - just a teaspoon size bite at a time.  Eat slowly.   It may also help to eat only one food at a time.  Consider nibbling through smaller, more frequent meals & avoid the urge to eat BIG meals  Do not push through feelings of fullness, nausea, or bloatedness  Do not mix solid foods and liquids in the same mouthful  Try not to "wash foods down" with large gulps of liquids.  Avoid carbonated (bubbly/fizzy) drinks.    Avoid foods that make you feel gassy or bloated.  Start with bland foods first.  Wait on trying greasy, fried, or spicy meals until you are tolerating more bland solids well.  Understand that it will be hard to burp and belch at first.  This gradually improves with time.  Expect to be more gassy/flatulent/bloated initially.  Walking will help your body manage it better.  Consider using medications for bloating that contain simethicone such as  Maalox or Gas-X   Eat in a relaxed atmosphere & minimize distractions.  Avoid talking while eating.    Do not use straws.  Following each meal, sit in an upright position (90 degree angle) for 60 to 90 minutes.  Going for a short walk can help as well  If food does stick, don't panic.  Try to relax and let the food pass on its own.  Sipping WARM LIQUID such as strong hot black tea can also help slide it down.   Be gradual in changes & use common sense:  -If you easily tolerating a certain "level" of foods, advance to the next level gradually -If you are  having trouble swallowing a particular food, then avoid it.   -If food is sticking when you advance your diet, go back to thinner previous diet (the lower LEVEL) for 1-2 days.  LEVEL 1 = PUREED DIET  Do for the first 2 WEEKS AFTER SURGERY  -Foods in this group are pureed or blenderized to a smooth, mashed potato-like consistency.  -If necessary, the pureed foods can keep their shape with the addition of a thickening agent.   -Meat should be pureed to a smooth, pasty consistency.  Hot broth or gravy may be added to the pureed  meat, approximately 1 oz. of liquid per 3 oz. serving of meat. -CAUTION:  If any foods do not puree into a smooth consistency, swallowing will be more difficult.  (For example, nuts or seeds sometimes do not blend well.)  Hot Foods Cold Foods  Pureed scrambled eggs and cheese Pureed cottage cheese  Baby cereals Thickened juices and nectars  Thinned cooked cereals (no lumps) Thickened milk or eggnog  Pureed [place] toast or pancakes Ensure  Mashed potatoes Ice cream  Pureed parsley, au gratin, scalloped potatoes, candied sweet potatoes Fruit or [place] ice, sherbet  Pureed buttered or alfredo noodles Plain yogurt  Pureed vegetables (no corn or peas) Instant breakfast  Pureed soups and creamed soups Smooth pudding, mousse, custard  Pureed scalloped apples Whipped gelatin  Gravies Sugar, syrup, honey, jelly  Sauces, cheese, tomato, barbecue, white, creamed Cream  Any baby food Creamer  Alcohol in moderation (not beer or champagne) Margarine  Coffee or tea Mayonnaise   Ketchup, mustard   Apple sauce   SAMPLE MENU:  PUREED DIET Breakfast Lunch Dinner   Orange juice, 1/2 cup  Cream of wheat, 1/2 cup  Pineapple juice, 1/2 cup  Pureed [place], barley soup, 3/4 cup  Pureed Hawaiian chicken, 3 oz   Scrambled eggs, mashed or blended with cheese, 1/2 cup  Tea or coffee, 1 cup   Whole milk, 1 cup   Non-dairy creamer, 2 Tbsp.  Mashed potatoes, 1/2 cup  Pureed cooled broccoli, 1/2 cup  Apple sauce, 1/2 cup  Coffee or tea  Mashed potatoes, 1/2 cup  Pureed spinach, 1/2 cup  Frozen yogurt, 1/2 cup  Tea or coffee      LEVEL 2 = SOFT DIET  After your first 2 weeks, you can advance to a soft diet.   Keep on this diet until everything goes down easily.  Hot Foods Cold Foods  White fish Cottage cheese  Stuffed fish Junior baby fruit  Baby food meals Semi thickened juices  Minced soft cooked, scrambled, poached eggs nectars  Souffle & omelets Ripe mashed bananas  Cooked  cereals Canned fruit, pineapple sauce, milk  potatoes Milkshake  Buttered or Alfredo noodles Custard  Cooked cooled vegetable Puddings, including tapioca  Sherbet Yogurt  Vegetable soup or alphabet soup Fruit ice, [place] ice  Gravies Whipped gelatin  Sugar, syrup, honey, jelly Junior baby desserts  Sauces:  Cheese, creamed, barbecue, tomato, white Cream  Coffee or tea Margarine   SAMPLE MENU:  LEVEL 2 Breakfast Lunch Dinner   Orange juice, 1/2 cup  Oatmeal, 1/2 cup  Scrambled eggs with cheese, 1/2 cup  Decaffeinated tea, 1 cup  Whole milk, 1 cup  Non-dairy creamer, 2 Tbsp  Pineapple juice, 1/2 cup  Minced beef, 3 oz  Gravy, 2 Tbsp  Mashed potatoes, 1/2 cup  Minced fresh broccoli, 1/2 cup  Applesauce, 1/2 cup  Coffee, 1 cup  [place], barley soup, 3/4 cup  Minced Hawaiian chicken, 3 oz  Mashed potatoes, 1/2 cup  Cooked spinach, 1/2 cup  Frozen yogurt, 1/2 cup  Non-dairy creamer, 2 Tbsp      LEVEL 3 = CHOPPED DIET  -After all the foods in level 2 (soft diet) are passing through well you should advance up to more chopped foods.  -It is still important to cut these foods into small pieces and eat slowly.  Hot Foods Cold Foods  Poultry Cottage cheese  Chopped Swedish meatballs Yogurt  Meat salads (ground or flaked meat) Milk  Flaked fish (tuna) Milkshakes  Poached or scrambled eggs Soft, cold, dry cereal  Souffles and omelets Fruit juices or nectars  Cooked cereals Chopped canned fruit  Chopped [place] toast or pancakes Canned fruit cocktail  Noodles or pasta (no rice) Pudding, mousse, custard  Cooked vegetables (no frozen peas, corn, or mixed vegetables) Green salad  Canned small sweet peas Ice cream  Creamed soup or vegetable soup Fruit ice, [place] ice  Pureed vegetable soup or alphabet soup Non-dairy creamer  Ground scalloped apples Margarine  Gravies Mayonnaise  Sauces:  Cheese, creamed, barbecue, tomato, white Ketchup  Coffee or tea Mustard    SAMPLE MENU:  LEVEL 3 Breakfast Lunch Dinner   Orange juice, 1/2 cup  Oatmeal, 1/2 cup  Scrambled eggs with cheese, 1/2 cup  Decaffeinated tea, 1 cup  Whole milk, 1 cup  Non-dairy creamer, 2 Tbsp  Ketchup, 1 Tbsp  Margarine, 1 tsp  Salt, 1/4 tsp  Sugar, 2 tsp  Pineapple juice, 1/2 cup  Ground beef, 3 oz  Gravy, 2 Tbsp  Mashed potatoes, 1/2 cup  Cooked spinach, 1/2 cup  Applesauce, 1/2 cup  Decaffeinated coffee  Whole milk  Non-dairy creamer, 2 Tbsp  Margarine, 1 tsp  Salt, 1/4 tsp  Pureed [place], barley soup, 3/4 cup  Barbecue chicken, 3 oz  Mashed potatoes, 1/2 cup  Ground fresh broccoli, 1/2 cup  Frozen yogurt, 1/2 cup  Decaffeinated tea, 1 cup  Non-dairy creamer, 2 Tbsp  Margarine, 1 tsp  Salt, 1/4 tsp  Sugar, 1 tsp    LEVEL 4:  REGULAR FOODS  -Foods in this group are soft, moist, regularly textured foods.   -This level includes meat and breads, which tend to be the hardest things to swallow.   -Eat very slowly, chew well and continue to avoid carbonated drinks. -most people are at this level in 4-6 weeks  Hot Foods Cold Foods  Baked fish or skinned Soft cheeses - cottage cheese  Souffles and omelets Cream cheese  Eggs Yogurt  Stuffed shells Milk  Spaghetti with meat sauce Milkshakes  Cooked cereal Cold dry cereals (no nuts, dried fruit, coconut)  [place] toast or pancakes Crackers  Buttered toast Fruit juices or nectars  Noodles or pasta (no rice) Canned fruit  Potatoes (all types) Ripe bananas  Soft, cooked vegetables (no corn, lima, or baked beans) Peeled, ripe, fresh fruit  Creamed soups or vegetable soup Cakes (no nuts, dried fruit, coconut)  Canned chicken noodle soup Plain doughnuts  Gravies Ice cream  Bacon dressing Pudding, mousse, custard  Sauces:  Cheese, creamed, barbecue, tomato, white Fruit ice, [place] ice, sherbet  Decaffeinated tea or coffee Whipped gelatin  Pork chops Regular gelatin   Canned fruited  gelatin molds   Sugar, syrup, honey, jam, jelly   Cream   Non-dairy   Margarine   Oil   Mayonnaise   Ketchup   Mustard   TROUBLESHOOTING IRREGULAR BOWELS  1) Avoid extremes of bowel  movements (no bad constipation/diarrhea)  °2) Miralax 17gm mixed in 8oz. water or juice-daily. May use BID as needed.  °3) Gas-x,Phazyme, etc. as needed for gas & bloating.  °4) Soft,bland diet. No spicy,greasy,fried foods.  °5) Prilosec over-the-counter as needed  °6) May hold gluten/wheat products from diet to see if symptoms improve.  °7) May try probiotics (Align, Activa, etc) to help calm the bowels down  °7) If symptoms become worse call back immediately. ° ° ° °If you have any questions please call our office at [facility]: [phone]. ° °

## 2018-04-19 NOTE — Transfer of Care (Signed)
Immediate Anesthesia Transfer of Care Note  Patient: Kelly Watkins  Procedure(s) Performed: XI ROBOTIC HIATAL HERNIA REPAIR WITH MESH AND FUNDOPLICATION (N/A ) INSERTION OF MESH (N/A )  Patient Location: PACU  Anesthesia Type:General  Level of Consciousness: awake, alert  and oriented  Airway & Oxygen Therapy: Patient Spontanous Breathing and Patient connected to face mask oxygen  Post-op Assessment: Report given to RN and Post -op Vital signs reviewed and stable  Post vital signs: Reviewed and stable  Last Vitals:  Vitals Value Taken Time  BP 154/107 04/19/2018  1:15 PM  Temp    Pulse 79 04/19/2018  1:14 PM  Resp 25 04/19/2018  1:16 PM  SpO2 100 % 04/19/2018  1:14 PM  Vitals shown include unvalidated device data.  Last Pain:  Vitals:   04/19/18 1025  TempSrc:   PainSc: 0-No pain      Patients Stated Pain Goal: 4 (42/[id number] 3762)  Complications: No apparent anesthesia complications

## 2018-04-19 NOTE — Anesthesia Preprocedure Evaluation (Addendum)
Anesthesia Evaluation  Patient identified by MRN, date of birth, ID band Patient awake    Reviewed: Allergy & Precautions, NPO status , Patient's Chart, lab work & pertinent test results  Airway Mallampati: I       Dental no notable dental hx. (+) Teeth Intact   Pulmonary neg pulmonary ROS,    Pulmonary exam normal breath sounds clear to auscultation       Cardiovascular hypertension, Pt. on medications Normal cardiovascular exam Rhythm:Regular Rate:Normal     Neuro/Psych negative neurological ROS  negative psych ROS   GI/Hepatic Neg liver ROS, GERD  Medicated,  Endo/Other    Renal/GU negative Renal ROS  negative genitourinary   Musculoskeletal negative musculoskeletal ROS (+)   Abdominal Normal abdominal exam  (+) - obese,   Peds  Hematology   Anesthesia Other Findings   Reproductive/Obstetrics                             Anesthesia Physical Anesthesia Plan  ASA: II  Anesthesia Plan: General   Post-op Pain Management:    Induction: Intravenous  PONV Risk Score and Plan: 4 or greater and Ondansetron, Dexamethasone and Midazolam  Airway Management Planned: Oral ETT  Additional Equipment:   Intra-op Plan:   Post-operative Plan: Extubation in OR  Informed Consent: I have reviewed the patients History and Physical, chart, labs and discussed the procedure including the risks, benefits and alternatives for the proposed anesthesia with the patient or authorized representative who has indicated his/her understanding and acceptance.   Dental advisory given  Plan Discussed with: CRNA and Surgeon  Anesthesia Plan Comments:         Anesthesia Quick Evaluation

## 2018-04-20 ENCOUNTER — Observation Stay (HOSPITAL_COMMUNITY): Payer: BLUE CROSS/BLUE SHIELD

## 2018-04-20 ENCOUNTER — Encounter (HOSPITAL_COMMUNITY): Payer: Self-pay | Admitting: General Surgery

## 2018-04-20 DIAGNOSIS — K317 Polyp of stomach and duodenum: Secondary | ICD-10-CM | POA: Diagnosis not present

## 2018-04-20 DIAGNOSIS — Z79899 Other long term (current) drug therapy: Secondary | ICD-10-CM | POA: Diagnosis not present

## 2018-04-20 DIAGNOSIS — I10 Essential (primary) hypertension: Secondary | ICD-10-CM | POA: Diagnosis not present

## 2018-04-20 DIAGNOSIS — K449 Diaphragmatic hernia without obstruction or gangrene: Secondary | ICD-10-CM | POA: Diagnosis not present

## 2018-04-20 DIAGNOSIS — K219 Gastro-esophageal reflux disease without esophagitis: Secondary | ICD-10-CM | POA: Diagnosis not present

## 2018-04-20 DIAGNOSIS — Z882 Allergy status to sulfonamides status: Secondary | ICD-10-CM | POA: Diagnosis not present

## 2018-04-20 DIAGNOSIS — K224 Dyskinesia of esophagus: Secondary | ICD-10-CM | POA: Diagnosis not present

## 2018-04-20 DIAGNOSIS — Z880 Allergy status to penicillin: Secondary | ICD-10-CM | POA: Diagnosis not present

## 2018-04-20 LAB — BASIC METABOLIC PANEL
Anion gap: 8 (ref 5–15)
BUN: 15 mg/dL (ref 8–23)
CO2: 25 mmol/L (ref 22–32)
Calcium: 8.4 mg/dL — ABNORMAL LOW (ref 8.9–10.3)
Chloride: 109 mmol/L (ref 98–111)
Creatinine, Ser: 0.69 mg/dL (ref 0.44–1.00)
GFR calc Af Amer: >60 mL/min (ref >60)
Glucose, Bld: 152 mg/dL — ABNORMAL HIGH (ref 70–99)
Potassium: 4.2 mmol/L (ref 3.5–5.1)
Sodium: 142 mmol/L (ref 135–145)

## 2018-04-20 MED ORDER — IOHEXOL 300 MG/ML  SOLN
150.0000 mL | Freq: Once | INTRAMUSCULAR | Status: AC | PRN
  Administered 2018-04-20: 70 mL via ORAL

## 2018-04-20 MED ORDER — TRAMADOL HCL 50 MG PO TABS: 50.0000 mg | ORAL_TABLET | Freq: Four times a day (QID) | ORAL | 0 refills | Status: AC | PRN

## 2018-04-20 NOTE — Discharge Summary (Signed)
Physician Discharge Summary  Patient ID: Kelly Watkins MRN: [id number] DOB/AGE: [birth year] 62 y.o.  Admit date: 04/19/2018 Discharge date: 04/20/2018  Admission Diagnoses: s/p hiatal hernia repair  Discharge Diagnoses:  Active Problems:   Hx of hiatal hernia   Discharged Condition: good  Hospital Course: Patient was admitted status post robotic hiatal hernia repair and toupee fundoplication. Patient did well postoperatively.  She had good pain control.  She was otherwise ambulate well on her own.  Postop day 1 patient went esophagram which revealed some edema however no leaks in the esophagus.  She started on liquid diet.  She tolerated this well.  Patient denies any reflux on postop day 1.  Patient was deemed stable for discharge and discharged home.  Consults: None  Significant Diagnostic Studies: esophagram: no leaks  Treatments: surgery: asa bove  Discharge Exam: Blood pressure (!) 148/63, pulse 70, temperature 98.9 F (37.2 C), temperature source Oral, resp. rate 14, height 5' 4.75" (1.645 m), weight 83.7 kg, SpO2 95 %. General appearance: alert and cooperative GI: soft, non-tender; bowel sounds normal; no masses,  no organomegaly  Disposition:    Allergies as of 04/20/2018      Reactions   Hydrochlorothiazide Itching   Latex Itching   Penicillins Itching   Has patient had a PCN reaction causing immediate rash, facial/tongue/throat swelling, SOB or lightheadedness with hypotension: yes Has patient had a PCN reaction causing severe rash involving mucus membranes or skin necrosis: no Has patient had a PCN reaction that required hospitalization: no Has patient had a PCN reaction occurring within the last 10 years: no If all of the above answers are "NO", then may proceed with Cephalosporin use.   Sulfa Antibiotics Itching   Estradiol Rash   "ORAL MEDICATION"      Medication List    TAKE these medications   cetirizine 10 MG tablet Commonly known as:  ZYRTEC Take 10  mg by mouth at bedtime.   clobetasol ointment 0.05 % Commonly known as:  TEMOVATE Apply 1 application topically daily.   CoQ10 100 MG Caps Take 100 mg by mouth daily.   cycloSPORINE 0.05 % ophthalmic emulsion Commonly known as:  RESTASIS Place 1 drop into both eyes daily.   ELDERBERRY PO Take by mouth daily.   Estradiol 10 MCG Tabs vaginal tablet Place 1 tablet vaginally 3 (three) times a week.   furosemide 20 MG tablet Commonly known as:  LASIX Take 20 mg by mouth as needed for fluid or edema.   Krill Oil 500 MG Caps Take 500 mg by mouth daily.   losartan 100 MG tablet Commonly known as:  COZAAR Take 100 mg by mouth every morning.   omeprazole 20 MG capsule Commonly known as:  PRILOSEC Take 1 capsule (20 mg total) by mouth daily. What changed:  when to take this   prenatal multivitamin Tabs tablet Take 1 tablet by mouth daily at 12 noon.   traMADol 50 MG tablet Commonly known as:  ULTRAM Take 1 tablet (50 mg total) by mouth every 6 (six) hours as needed.      Follow-up Information    Ralene Ok, MD. Schedule an appointment as soon as possible for a visit in 2 weeks.   Specialty:  General Surgery Why:  For wound re-check Contact information: [address] [place] [address] [phone]           Signed: Ralene Ok 04/20/2018, 10:04 AM

## 2018-04-20 NOTE — Progress Notes (Addendum)
Nutrition Education Note  RD consulted for nutrition education regarding patient who is s/p toupet fundoplication.  RD provided handout on Full Liquid/Pureed Diet nutrition therapy. Reviewed clear/ full liquids. Encouraged pt to avoid caffeine, carbonated beverages [place] of straws.  Provided examples of appropriate foods on a soft diet. Reviewed gas producing foods. Discouraged intake of processed foods and red meat. Recommended use of a liquid multivitamin while following a liquid diet. Reviewed acceptable protein supplements.  RD discussed why it is important for patient to adhere to diet recommendations. Teach back method used.  Expect great compliance.   Body mass index is 30.94 kg/m.  Pt meets criteria for obese based on current BMI.  Labs and medications reviewed. No further nutrition interventions warranted at this time. If additional nutrition issues arise, please re-consult RD.   Mariana Single RD, LDN Clinical Nutrition Pager # [phone]

## 2018-04-20 NOTE — Progress Notes (Signed)
Discharge instructions given to pt and all questions were answered. Pt taken down via wheelchair and was picked up by her husband.  

## 2018-08-04 DIAGNOSIS — Z23 Encounter for immunization: Secondary | ICD-10-CM | POA: Diagnosis not present

## 2019-02-08 DIAGNOSIS — Z23 Encounter for immunization: Secondary | ICD-10-CM | POA: Diagnosis not present

## 2019-03-02 DIAGNOSIS — Z961 Presence of intraocular lens: Secondary | ICD-10-CM | POA: Diagnosis not present

## 2019-03-09 DIAGNOSIS — R7309 Other abnormal glucose: Secondary | ICD-10-CM | POA: Diagnosis not present

## 2019-03-09 DIAGNOSIS — Z6831 Body mass index (BMI) 31.0-31.9, adult: Secondary | ICD-10-CM | POA: Diagnosis not present

## 2019-03-09 DIAGNOSIS — I10 Essential (primary) hypertension: Secondary | ICD-10-CM | POA: Diagnosis not present

## 2019-03-09 DIAGNOSIS — R946 Abnormal results of thyroid function studies: Secondary | ICD-10-CM | POA: Diagnosis not present

## 2019-03-09 DIAGNOSIS — E7849 Other hyperlipidemia: Secondary | ICD-10-CM | POA: Diagnosis not present

## 2019-03-09 DIAGNOSIS — Z1389 Encounter for screening for other disorder: Secondary | ICD-10-CM | POA: Diagnosis not present

## 2019-03-09 DIAGNOSIS — E6609 Other obesity due to excess calories: Secondary | ICD-10-CM | POA: Diagnosis not present

## 2019-03-18 ENCOUNTER — Emergency Department (HOSPITAL_COMMUNITY)
Admission: EM | Admit: 2019-03-18 | Discharge: 2019-03-18 | Disposition: A | Discharge status: Home / Self Care | Payer: BC Managed Care – PPO | Attending: Emergency Medicine | Admitting: Emergency Medicine

## 2019-03-18 ENCOUNTER — Other Ambulatory Visit: Payer: Self-pay

## 2019-03-18 DIAGNOSIS — Z79899 Other long term (current) drug therapy: Secondary | ICD-10-CM | POA: Insufficient documentation

## 2019-03-18 DIAGNOSIS — Z91040 Latex allergy status: Secondary | ICD-10-CM | POA: Diagnosis not present

## 2019-03-18 DIAGNOSIS — R21 Rash and other nonspecific skin eruption: Secondary | ICD-10-CM

## 2019-03-18 DIAGNOSIS — Z20828 Contact with and (suspected) exposure to other viral communicable diseases: Secondary | ICD-10-CM | POA: Insufficient documentation

## 2019-03-18 DIAGNOSIS — I10 Essential (primary) hypertension: Secondary | ICD-10-CM | POA: Diagnosis not present

## 2019-03-18 DIAGNOSIS — Z03818 Encounter for observation for suspected exposure to other biological agents ruled out: Secondary | ICD-10-CM | POA: Diagnosis not present

## 2019-03-18 MED ORDER — CLOTRIMAZOLE 1 % EX CREA: TOPICAL_CREAM | CUTANEOUS | 0 refills | Status: AC

## 2019-03-18 NOTE — ED Triage Notes (Signed)
Pt reports "hive like rash" to right side of abdomen, bilateral inner groin, under left breast. Pt reports started taking bendaryl x2 days ago and reports continued itching. Pt also states was exposed to a co-worker that tested positive for COVID 9 days ago. Pt reports is currently asymptomatic since exposure.

## 2019-03-18 NOTE — Discharge Instructions (Signed)
Stop using the hydrocortisone cream Start using clotrimazole as well as lovastatin. Follow-up with a dermatologist as needed if rash continues. Use Benadryl or calamine as needed for itching. Return to the emergency room if you develop high fevers or any new, worsening, or concerning symptoms.

## 2019-03-18 NOTE — ED Provider Notes (Signed)
[facility] EMERGENCY DEPARTMENT Provider Note   CSN: [id number] Arrival date & time: 03/18/19  [id number]     History   Chief Complaint Chief Complaint  Patient presents with  . Rash    HPI Andreanna MAHOGONY HARRIER is a 63 y.o. female presenting for evaluation of rash.  Patient states 2 weeks ago she developed a rash on her left groin.  She has been applying nystatin and hydrocortisone cream.  The creams per itching, however the rash continued to spread.  Rash is currently now also under her right breast and on her abdomen.  Rash is itchy, nontender.  She denies fevers, chills, cough, shortness of breath.  She denies drainage.  She denies new soaps, detergents, environments, or medications.  Patient states she had a similar rash 2 years ago which resolved with 2 courses of prednisone.  Additionally, patient states she had a Covid exposure at work.  She reports one of her coworkers tested +9 days ago, and patient spent an hour in the room with her.  Both patient and coworker were wearing a mask.  Patient without fevers, chills, cough, shortness of breath, body aches, loss of taste or smell.     HPI  Past Medical History:  Diagnosis Date  . Cough   . Dysphagia   . GERD (gastroesophageal reflux disease)   . Hiatal hernia   . Hypertension   . IDA (iron deficiency anemia)    hx IV iron (followed by [facility])---  PER PT LAST INFUSION SPRING 2019  . Pre-diabetes   . Schatzki's ring    s/p  EGD w/ dilation    Patient Active Problem List   Diagnosis Date Noted  . Hx of hiatal hernia 04/19/2018  . Esophageal dysphagia 12/28/2017  . Absolute anemia 06/24/2016  . Iron deficiency 07/20/2015  . Thrombocytosis ([place]) 07/10/2015  . Essential hypertension 05/29/2014    Past Surgical History:  Procedure Laterality Date  . CATARACT EXTRACTION W/ INTRAOCULAR LENS  IMPLANT, BILATERAL  2015  . COLONOSCOPY    . COLONOSCOPY N/A 06/07/2014   Procedure: COLONOSCOPY;  Surgeon: Rogene Houston, MD;  [facility];  Service: Endoscopy;  Laterality: N/A;  1250  . ESOPHAGOGASTRODUODENOSCOPY N/A 06/07/2014   Procedure: ESOPHAGOGASTRODUODENOSCOPY (EGD);  Surgeon: Rogene Houston, MD;  [facility];  Service: Endoscopy;  Laterality: N/A;  . ESOPHAGOGASTRODUODENOSCOPY N/A 02/23/2018   Procedure: ESOPHAGOGASTRODUODENOSCOPY (EGD);  Surgeon: Rogene Houston, MD;  [facility];  Service: Endoscopy;  Laterality: N/A;  12:20  . ESOPHAGOGASTRODUODENOSCOPY (EGD) WITH ESOPHAGEAL DILATION     X 2  . INSERTION OF MESH N/A 04/19/2018   Procedure: INSERTION OF MESH;  Surgeon: Ralene Ok, MD;  Location: [facility];  Service: General;  Laterality: N/A;  . LAPAROSCOPIC VAGINAL HYSTERECTOMY WITH SALPINGO OOPHORECTOMY Bilateral 04-23-2002   DR Faye Ramsay  @[facility]    W/  ANTERIOR REPAIR AND ABLATION ENDOMETRIOSIS  . MALONEY DILATION N/A 06/07/2014   Procedure: [name] DILATION;  Surgeon: Rogene Houston, MD;  [facility];  Service: Endoscopy;  Laterality: N/A;  . MANDIBLE SURGERY Bilateral 1987   CORRECT BITE  . TUBAL LIGATION Bilateral YRS AGO     OB History   No obstetric history on file.      Home Medications    Prior to Admission medications   Medication Sig Start Date End Date Taking? Authorizing Provider  cetirizine (ZYRTEC) 10 MG tablet Take 10 mg by mouth at bedtime.  [provider]  clobetasol ointment (TEMOVATE) [id number] % Apply 1 application topically daily.  11/12/15   [provider]  clotrimazole (LOTRIMIN) 1 % cream Apply to affected area 2 times daily 03/18/19   Lacee Grey, PA-C  Coenzyme Q10 (COQ10) 100 MG CAPS Take 100 mg by mouth daily.     [provider]  cycloSPORINE (RESTASIS) 0.05 % ophthalmic emulsion Place 1 drop into both eyes daily.    [provider]  ELDERBERRY PO Take by mouth daily.    [provider]  Estradiol 10 MCG TABS vaginal tablet Place 1 tablet vaginally 3 (three)  times a week.     [provider]  furosemide (LASIX) 20 MG tablet Take 20 mg by mouth as needed for fluid or edema.     [provider]  Javier Docker Oil 500 MG CAPS Take 500 mg by mouth daily.     [provider]  losartan (COZAAR) 100 MG tablet Take 100 mg by mouth every morning.  09/02/15   [provider]  omeprazole (PRILOSEC) 20 MG capsule Take 1 capsule (20 mg total) by mouth daily. Patient taking differently: Take 20 mg by mouth every morning.  07/11/17   Setzer, Rona Ravens, NP  Prenatal Vit-Fe Fumarate-FA (PRENATAL MULTIVITAMIN) TABS tablet Take 1 tablet by mouth daily at 12 noon.    [provider]  traMADol (ULTRAM) 50 MG tablet Take 1 tablet (50 mg total) by mouth every 6 (six) hours as needed. 04/20/18 04/20/19  Ralene Ok, MD    Family History Family History  Problem Relation Age of Onset  . Hypertension Mother     Social History Social History   Tobacco Use  . Smoking status: Never Smoker  . Smokeless tobacco: Never Used  Substance Use Topics  . Alcohol use: No    Alcohol/week: 0.0 standard drinks  . Drug use: Never     Allergies   Hydrochlorothiazide, Latex, Penicillins, Sulfa antibiotics, and Estradiol   Review of Systems Review of Systems  Constitutional: Negative for fever.  Skin: Positive for rash.     Physical Exam Updated Vital Signs BP (!) 114/92 (BP Location: Right Arm)   Pulse 95   Temp 98.2 F (36.8 C) (Oral)   Resp 18   Ht 5\' 4"  (1.626 m)   Wt 82.6 kg   SpO2 97%   BMI 31.24 kg/m   Physical Exam Vitals signs and nursing note reviewed.  Constitutional:      General: She is not in acute distress.    Appearance: She is well-developed.     Comments: Resting comfortably in the bed in no acute distress  HENT:     Head: Normocephalic and atraumatic.  Neck:     Musculoskeletal: Normal range of motion.  Cardiovascular:     Rate and Rhythm: Normal rate and regular rhythm.     Pulses: Normal pulses.   Pulmonary:     Effort: Pulmonary effort is normal.     Breath sounds: Normal breath sounds.  Abdominal:     General: There is no distension.  Musculoskeletal: Normal range of motion.  Skin:    General: Skin is warm.     Capillary Refill: Capillary refill takes less than 2 seconds.     Findings: Rash present.     Comments: Erythematous slightly annular rash noted of the left groin, under the right breast, and in the middle of the abdomen.  No tenderness.  No vesicles.  No blisters or bulla.  Neurological:     Mental Status: She is alert and oriented to person, place, and time.      ED Treatments / Results  Labs (all labs ordered are listed, but only abnormal results are displayed) Labs Reviewed  NOVEL CORONAVIRUS, NAA (HOSP ORDER, SEND-OUT TO REF LAB; TAT 18-24 HRS)    EKG None  Radiology No results found.  Procedures Procedures (including critical care time)  Medications Ordered in ED Medications - No data to display   Initial Impression / Assessment and Plan / ED Course  I have reviewed the triage vital signs and the nursing notes.  Pertinent labs & imaging results that were available during my care of the patient were reviewed by me and considered in my medical decision making (see chart for details).        Patient sent in for evaluation of rash.  Physical exam shows patient appears nontoxic.  Rashes been going on for several weeks, and no associated symptoms such as fevers.  Doubt SJS, TEN, RPR, RMSF.  As rash is worsening despite use of hydrocortisone and nystatin, consider tinea versus other fungal cause.  Will have patient stop hydrocortisone, and start with clotrimazole.  Patient requesting course of steroids, however I discussed that at this time I do not believe she needs systemic steroids.  Encourage follow-up with dermatology if rash is not improving. Additionally, patient requesting Covid testing due to recent exposure.  Patient without current Covid  symptoms.  Will perform send out test.  At this time, patient be safe to discharge.  Return precautions given.  Patient states he understands and agrees plan.  Raziyah SHATOYA FINK was evaluated in Emergency Department on 03/18/2019 for the symptoms described in the history of present illness. She was evaluated in the context of the global COVID-19 pandemic, which necessitated consideration that the patient might be at risk for infection with the SARS-CoV-2 virus that causes COVID-19. Institutional protocols and algorithms that pertain to the evaluation of patients at risk for COVID-19 are in a state of rapid change based on information released by regulatory bodies including the CDC and federal and state organizations. These policies and algorithms were followed during the patient's care in the ED.  Final Clinical Impressions(s) / ED Diagnoses   Final diagnoses:  Rash and nonspecific skin eruption    ED Discharge Orders         Ordered    clotrimazole (LOTRIMIN) 1 % cream     03/18/19 1020           Sarabelle Genson, PA-C 03/18/19 1619    Nat Christen, MD 03/19/19 [phone]

## 2019-03-19 LAB — NOVEL CORONAVIRUS, NAA (HOSP ORDER, SEND-OUT TO REF LAB; TAT 18-24 HRS): SARS-CoV-2, NAA: NOT DETECTED

## 2019-03-20 DIAGNOSIS — L259 Unspecified contact dermatitis, unspecified cause: Secondary | ICD-10-CM | POA: Diagnosis not present

## 2019-03-20 DIAGNOSIS — B373 Candidiasis of vulva and vagina: Secondary | ICD-10-CM | POA: Diagnosis not present

## 2019-04-02 DIAGNOSIS — D2261 Melanocytic nevi of right upper limb, including shoulder: Secondary | ICD-10-CM | POA: Diagnosis not present

## 2019-04-02 DIAGNOSIS — D485 Neoplasm of uncertain behavior of skin: Secondary | ICD-10-CM | POA: Diagnosis not present

## 2019-04-02 DIAGNOSIS — L281 Prurigo nodularis: Secondary | ICD-10-CM | POA: Diagnosis not present

## 2019-04-02 DIAGNOSIS — L304 Erythema intertrigo: Secondary | ICD-10-CM | POA: Diagnosis not present

## 2019-04-02 DIAGNOSIS — D225 Melanocytic nevi of trunk: Secondary | ICD-10-CM | POA: Diagnosis not present

## 2019-04-20 DIAGNOSIS — Z1231 Encounter for screening mammogram for malignant neoplasm of breast: Secondary | ICD-10-CM | POA: Diagnosis not present

## 2019-04-20 DIAGNOSIS — Z6831 Body mass index (BMI) 31.0-31.9, adult: Secondary | ICD-10-CM | POA: Diagnosis not present

## 2019-04-20 DIAGNOSIS — Z01419 Encounter for gynecological examination (general) (routine) without abnormal findings: Secondary | ICD-10-CM | POA: Diagnosis not present

## 2019-05-10 DIAGNOSIS — B349 Viral infection, unspecified: Secondary | ICD-10-CM | POA: Diagnosis not present

## 2019-05-10 DIAGNOSIS — Z20828 Contact with and (suspected) exposure to other viral communicable diseases: Secondary | ICD-10-CM | POA: Diagnosis not present

## 2019-05-14 DIAGNOSIS — Z1159 Encounter for screening for other viral diseases: Secondary | ICD-10-CM | POA: Diagnosis not present

## 2019-05-22 DIAGNOSIS — Z23 Encounter for immunization: Secondary | ICD-10-CM | POA: Diagnosis not present

## 2019-06-19 DIAGNOSIS — Z23 Encounter for immunization: Secondary | ICD-10-CM | POA: Diagnosis not present

## 2019-09-21 DIAGNOSIS — D473 Essential (hemorrhagic) thrombocythemia: Secondary | ICD-10-CM | POA: Diagnosis not present

## 2019-09-21 DIAGNOSIS — R0789 Other chest pain: Secondary | ICD-10-CM | POA: Diagnosis not present

## 2019-09-21 DIAGNOSIS — R7309 Other abnormal glucose: Secondary | ICD-10-CM | POA: Diagnosis not present

## 2019-09-21 DIAGNOSIS — D509 Iron deficiency anemia, unspecified: Secondary | ICD-10-CM | POA: Diagnosis not present

## 2019-09-21 DIAGNOSIS — Z1389 Encounter for screening for other disorder: Secondary | ICD-10-CM | POA: Diagnosis not present

## 2019-09-21 DIAGNOSIS — E7849 Other hyperlipidemia: Secondary | ICD-10-CM | POA: Diagnosis not present

## 2019-09-21 DIAGNOSIS — Z6830 Body mass index (BMI) 30.0-30.9, adult: Secondary | ICD-10-CM | POA: Diagnosis not present

## 2019-09-21 DIAGNOSIS — E6609 Other obesity due to excess calories: Secondary | ICD-10-CM | POA: Diagnosis not present

## 2019-09-21 DIAGNOSIS — Z0001 Encounter for general adult medical examination with abnormal findings: Secondary | ICD-10-CM | POA: Diagnosis not present

## 2019-09-21 DIAGNOSIS — I10 Essential (primary) hypertension: Secondary | ICD-10-CM | POA: Diagnosis not present

## 2019-10-25 ENCOUNTER — Encounter: Payer: Self-pay | Admitting: *Deleted

## 2019-10-26 ENCOUNTER — Encounter: Payer: Self-pay | Admitting: *Deleted

## 2019-10-26 ENCOUNTER — Other Ambulatory Visit: Payer: Self-pay

## 2019-10-26 ENCOUNTER — Ambulatory Visit: Payer: Medicare Other | Admitting: Cardiology

## 2019-10-26 ENCOUNTER — Telehealth: Payer: Self-pay | Admitting: Cardiology

## 2019-10-26 ENCOUNTER — Encounter: Payer: Self-pay | Admitting: Cardiology

## 2019-10-26 VITALS — BP 148/100 | HR 74 | Ht 64.0 in | Wt 179.4 lb

## 2019-10-26 DIAGNOSIS — R0600 Dyspnea, unspecified: Secondary | ICD-10-CM | POA: Diagnosis not present

## 2019-10-26 DIAGNOSIS — R0609 Other forms of dyspnea: Secondary | ICD-10-CM

## 2019-10-26 DIAGNOSIS — I10 Essential (primary) hypertension: Secondary | ICD-10-CM | POA: Diagnosis not present

## 2019-10-26 DIAGNOSIS — R011 Cardiac murmur, unspecified: Secondary | ICD-10-CM

## 2019-10-26 DIAGNOSIS — R072 Precordial pain: Secondary | ICD-10-CM

## 2019-10-26 NOTE — Telephone Encounter (Signed)
Pre-cert Verification for the following procedure    LEXISCAN & ECHO   DATE:  11/08/2019  LOCATION:  [facility]

## 2019-10-26 NOTE — Progress Notes (Signed)
Cardiology Office Note  Date: 10/26/2019   ID: Kelly Watkins, DOB 15-Apr-1956, MRN [id number]  PCP:  Sharilyn Sites, MD  Cardiologist:  Rozann Lesches, MD Electrophysiologist:  None   Chief Complaint  Patient presents with   Dyspnea on exertion    History of Present Illness: Kelly Watkins is a 64 y.o. female referred for cardiology consultation by Dr. Hilma Favors for evaluation of dyspnea on exertion.  Her husband is a patient of mine.  She tells me that she has had a relative lack of stamina, fatigue, and dyspnea on exertion over the last several months.  She thought that it might have been related to work stress.  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  She does not describe any definite chest pain however.  There is a family history of CAD on her mother side, she also has a personal history of hypertension.  I personally reviewed her recent tracing from [place] which shows normal sinus rhythm with decreased R wave progression and nonspecific T wave changes, similar to old tracing.  She reports undergoing a stress test approximately 10 years ago, no more recent evaluations.  Blood pressure was elevated today, she states that this has not been the case in general, I asked her to check it periodically at home.  She reports compliance with her medications, outlined below.  Past Medical History:  Diagnosis Date   Dysphagia    Essential hypertension    GERD (gastroesophageal reflux disease)    Hiatal hernia    IDA (iron deficiency anemia)    hx IV iron (followed by [facility])---  PER PT LAST INFUSION SPRING 2019   Pre-diabetes    Schatzki's ring    s/p  EGD w/ dilation    Past Surgical History:  Procedure Laterality Date   CATARACT EXTRACTION W/ INTRAOCULAR LENS  IMPLANT, BILATERAL  2015   COLONOSCOPY     COLONOSCOPY N/A 06/07/2014   Procedure: COLONOSCOPY;  Surgeon: Rogene Houston, MD;   [facility];  Service: Endoscopy;  Laterality: N/A;  1250   ESOPHAGOGASTRODUODENOSCOPY N/A 06/07/2014   Procedure: ESOPHAGOGASTRODUODENOSCOPY (EGD);  Surgeon: Rogene Houston, MD;  [facility];  Service: Endoscopy;  Laterality: N/A;   ESOPHAGOGASTRODUODENOSCOPY N/A 02/23/2018   Procedure: ESOPHAGOGASTRODUODENOSCOPY (EGD);  Surgeon: Rogene Houston, MD;  [facility];  Service: Endoscopy;  Laterality: N/A;  12:20   ESOPHAGOGASTRODUODENOSCOPY (EGD) WITH ESOPHAGEAL DILATION     X 2   INSERTION OF MESH N/A 04/19/2018   Procedure: INSERTION OF MESH;  Surgeon: Ralene Ok, MD;  [facility];  Service: General;  Laterality: N/A;   LAPAROSCOPIC VAGINAL HYSTERECTOMY WITH SALPINGO OOPHORECTOMY Bilateral 04-23-2002   DR TOMBIN  @[facility]    W/  ANTERIOR REPAIR AND ABLATION ENDOMETRIOSIS   MALONEY DILATION N/A 06/07/2014   Procedure: [name] DILATION;  Surgeon: Rogene Houston, MD;  [facility];  Service: Endoscopy;  Laterality: N/A;   MANDIBLE SURGERY Bilateral 1987   CORRECT BITE   TUBAL LIGATION Bilateral YRS AGO    Current Outpatient Medications  Medication Sig Dispense Refill   cetirizine (ZYRTEC) 10 MG tablet Take 10 mg by mouth at bedtime.      cholecalciferol (VITAMIN D3) 25 MCG (1000 UNIT) tablet Take 1,000 Units by mouth in the morning and at bedtime.      clobetasol ointment (TEMOVATE) 7.16 % Apply 1 application topically daily.  0   clotrimazole (LOTRIMIN) 1 % cream Apply to affected area 2 times daily (Patient taking differently: Apply to affected area 2 times daily as needed) 15 g 0   cycloSPORINE (RESTASIS) 0.05 % ophthalmic emulsion Place 1 drop into both eyes daily.     ELDERBERRY PO Take by mouth daily.     Estradiol 10 MCG TABS vaginal tablet Place 1 tablet vaginally 3 (three) times a week.      furosemide (LASIX) 20 MG tablet Take 20 mg by mouth as needed for fluid or edema.      Krill Oil 500 MG CAPS Take 500 mg by  mouth daily.      losartan (COZAAR) 100 MG tablet Take 100 mg by mouth every morning.   0   Prenatal Vit-Fe Fumarate-FA (PRENATAL MULTIVITAMIN) TABS tablet Take 1 tablet by mouth daily at 12 noon.     No current facility-administered medications for this visit.   Allergies:  Hydrochlorothiazide, Latex, Penicillins, Sulfa antibiotics, and Estradiol   Social History: The patient  reports that she has never smoked. She has never used smokeless tobacco. She reports that she does not drink alcohol and does not use drugs.   Family History: The patient's family history includes Asthma in her father; Heart disease in her mother; Hypertension in her mother.   ROS:  Episodic lightheadedness, no definite palpitations or syncope.  Physical Exam: VS:  BP (!) 148/100 (BP Location: Left Arm, Cuff Size: Normal)    Pulse 74    Ht 5\' 4"  (1.626 m)    Wt 179 lb 6.4 oz (81.4 kg)    SpO2 98%    BMI 30.79 kg/m , BMI Body mass index is 30.79 kg/m.  Wt Readings from Last 3 Encounters:  10/26/19 179 lb 6.4 oz (81.4 kg)  03/18/19 182 lb (82.6 kg)  04/19/18 184 lb 8 oz (83.7 kg)    General: Patient appears comfortable at rest. HEENT: Conjunctiva and lids normal, wearing a mask. Neck: Supple, no elevated JVP or carotid bruits, no thyromegaly. Lungs: Clear to auscultation, nonlabored breathing at rest. Cardiac: Regular rate and rhythm, no S3, 2/6 systolic murmur, no pericardial rub. Abdomen: Soft, bowel sounds present. Extremities: No pitting edema, distal pulses 2+. Skin: Warm and dry. Musculoskeletal: No kyphosis. Neuropsychiatric: Alert and oriented x3, affect grossly appropriate.  ECG:  An ECG dated 04/07/2018 was personally reviewed today and demonstrated:  Normal sinus rhythm with decreased R wave progression and nonspecific T wave changes.  Recent Labwork:  May 2021: Hemoglobin 14.8, platelets 424, BUN 16, creatinine 0.83, potassium 4.1, AST 19, ALT 11, cholesterol 244, triglycerides 160, HDL 53,  LDL 162, hemoglobin A1c 5.9%, TSH 2.39  Other Studies Reviewed Today:  No prior cardiac testing for review today.  Assessment and Plan:  1.  Dyspnea on exertion with fatigue and lack of stamina over the last several months in a 64 year old woman with history of heart disease in her mother, personal history of hypertension, recent lab work showing hemoglobin A1c 5.9% and LDL 162.  ECG reviewed and nonspecific.  She has a systolic murmur in aortic position, does not sound stenotic however.  We will obtain an echocardiogram to assess cardiac structure and function and also a Lexiscan Myoview for ischemic assessment.  2.  Essential hypertension, blood pressure is elevated today.  She reports compliance with her medications including Cozaar 100 mg daily.  I asked her to check blood pressure periodically at home to see if further adjustments need to be made.  Medication Adjustments/Labs and Tests Ordered: Current medicines are reviewed at length with the patient today.  Concerns regarding medicines are outlined above.   Tests Ordered: Orders Placed This Encounter  Procedures   NM Myocar Multi W/Spect W/Wall Motion / EF   ECHOCARDIOGRAM COMPLETE    Medication Changes: No orders of the defined types were placed in this encounter.   Disposition:  Follow up test results.  Signed, Satira Sark, MD, [facility] 10/26/2019 1:48 PM    [facility] [address], [place], [place] [address] Phone: [phone]; Fax: [phone]

## 2019-10-26 NOTE — Patient Instructions (Addendum)
Medication Instructions:   Your physician recommends that you continue on your current medications as directed. Please refer to the Current Medication list given to you today.  Labwork:  NONE  Testing/Procedures: Your physician has requested that you have an echocardiogram. Echocardiography is a painless test that uses sound waves to create images of your heart. It provides your doctor with information about the size and shape of your heart and how well your heart's chambers and valves are working. This procedure takes approximately one hour. There are no restrictions for this procedure. Your physician has requested that you have a lexiscan myoview. For further information please visit www.cardiosmart.org. Please follow instruction sheet, as given.  Follow-Up:  Your physician recommends that you schedule a follow-up appointment in: pending.   Any Other Special Instructions Will Be Listed Below (If Applicable).  If you need a refill on your cardiac medications before your next appointment, please call your pharmacy. 

## 2019-11-08 ENCOUNTER — Other Ambulatory Visit: Payer: Self-pay

## 2019-11-08 ENCOUNTER — Encounter (HOSPITAL_COMMUNITY): Payer: Self-pay

## 2019-11-08 ENCOUNTER — Encounter (HOSPITAL_BASED_OUTPATIENT_CLINIC_OR_DEPARTMENT_OTHER)
Admission: RE | Admit: 2019-11-08 | Discharge: 2019-11-08 | Disposition: A | Discharge status: Home / Self Care | Payer: Medicare Other | Source: Ambulatory Visit | Attending: Cardiology | Admitting: Cardiology

## 2019-11-08 ENCOUNTER — Ambulatory Visit (HOSPITAL_COMMUNITY)
Admission: RE | Admit: 2019-11-08 | Discharge: 2019-11-08 | Disposition: A | Discharge status: Home / Self Care | Payer: Medicare Other | Source: Ambulatory Visit | Attending: Cardiology | Admitting: Cardiology

## 2019-11-08 ENCOUNTER — Encounter (HOSPITAL_COMMUNITY)
Admission: RE | Admit: 2019-11-08 | Discharge: 2019-11-08 | Disposition: A | Discharge status: Home / Self Care | Payer: Medicare Other | Source: Ambulatory Visit | Attending: Cardiology | Admitting: Cardiology

## 2019-11-08 DIAGNOSIS — R011 Cardiac murmur, unspecified: Secondary | ICD-10-CM | POA: Diagnosis not present

## 2019-11-08 DIAGNOSIS — R0609 Other forms of dyspnea: Secondary | ICD-10-CM | POA: Insufficient documentation

## 2019-11-08 DIAGNOSIS — R0600 Dyspnea, unspecified: Secondary | ICD-10-CM

## 2019-11-08 LAB — NM MYOCAR MULTI W/SPECT W/WALL MOTION / EF
LV dias vol: 54 mL (ref 46–106)
LV sys vol: 10 mL
Peak HR: 80 {beats}/min
RATE: 0.41
Rest HR: 62 {beats}/min
SDS: 0
SRS: 3
SSS: 3
TID: 1.08

## 2019-11-08 MED ORDER — REGADENOSON 0.4 MG/5ML IV SOLN
INTRAVENOUS | Status: AC
  Administered 2019-11-08: 0.4 mg via INTRAVENOUS
  Filled 2019-11-08: qty 5

## 2019-11-08 MED ORDER — SODIUM CHLORIDE FLUSH 0.9 % IV SOLN
INTRAVENOUS | Status: AC
  Administered 2019-11-08: 10 mL via INTRAVENOUS
  Filled 2019-11-08: qty 10

## 2019-11-08 MED ORDER — TECHNETIUM TC 99M TETROFOSMIN IV KIT
30.0000 | PACK | Freq: Once | INTRAVENOUS | Status: AC | PRN
  Administered 2019-11-08: 32 via INTRAVENOUS

## 2019-11-08 MED ORDER — TECHNETIUM TC 99M TETROFOSMIN IV KIT
10.0000 | PACK | Freq: Once | INTRAVENOUS | Status: AC | PRN
  Administered 2019-11-08: 10.87 via INTRAVENOUS

## 2019-11-08 NOTE — Progress Notes (Signed)
*  PRELIMINARY RESULTS* Echocardiogram 2D Echocardiogram has been performed.  Kelly Watkins 11/08/2019, 11:21 AM

## 2019-11-09 ENCOUNTER — Telehealth: Payer: Self-pay | Admitting: *Deleted

## 2019-11-09 NOTE — Telephone Encounter (Signed)
-----   Message from Satira Sark, MD sent at 11/08/2019 12:04 PM EDT ----- Results reviewed.  LVEF normal at 65 to 70% with mild diastolic dysfunction.  No evidence of aortic stenosis.  Await results of stress testing.

## 2019-11-09 NOTE — Telephone Encounter (Signed)
Patient informed. Copy sent to PCP °

## 2019-12-17 IMAGING — RF DG ESOPHAGUS
6 series · 14 of 15 positions shown · IV contrast (omnipaque)
Comparison: Preoperative esophagram 02/10/2018.

CLINICAL DATA: Postop day 1 laparoscopic robotic assisted hiatal
hernia repair with mesh and fundoplication.

EXAM:
ESOPHOGRAM/BARIUM SWALLOW
TECHNIQUE: Single contrast examination was performed using water-soluble
contrast, 70 mL of Omnipaque 300 orally.
FLUOROSCOPY TIME:  Fluoroscopy Time:  1 minute 0 seconds
Radiation Exposure Index (if provided by the fluoroscopic device):
13.3 mGy
Number of Acquired Spot Images: 0

[Series 1: t abdomen supine · 0.15mm/px · 1 of 1 slices shown]
[im 1/1]
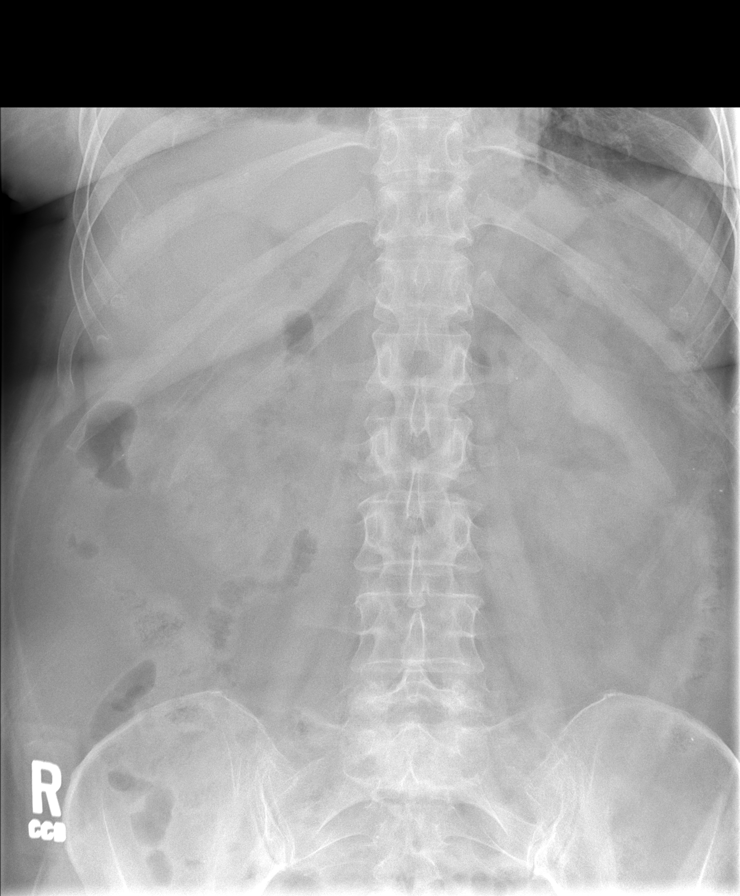

[Series 2: cp_standard · 0.42mm/px · 4 of 81 frames shown (1 of 5)]
[frame 13/81]
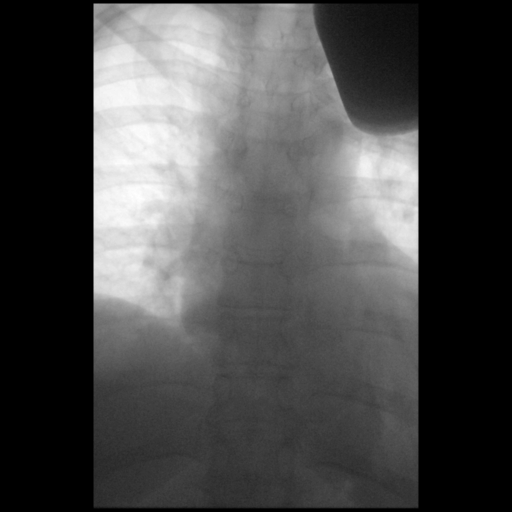
[frame 41/81]
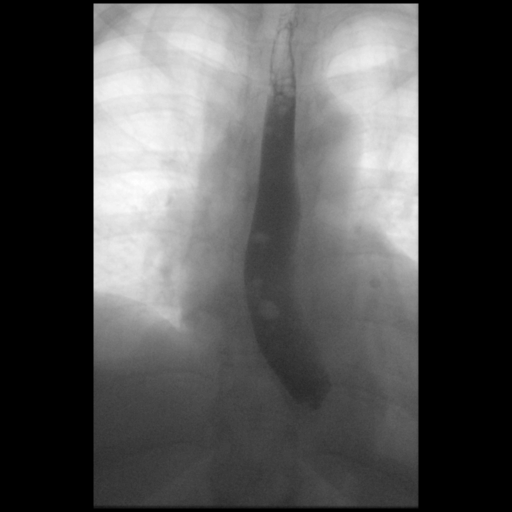
[frame 61/81]
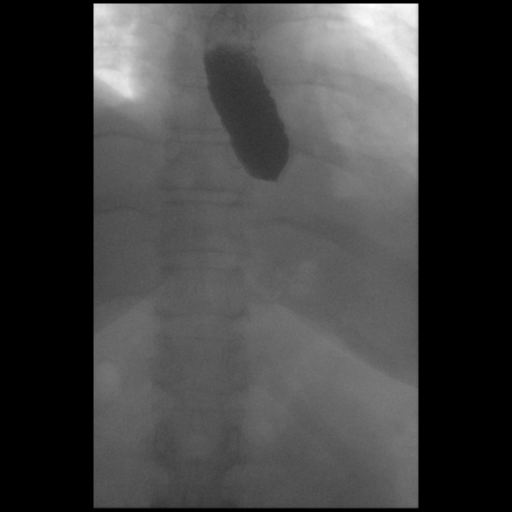
[frame 69/81]
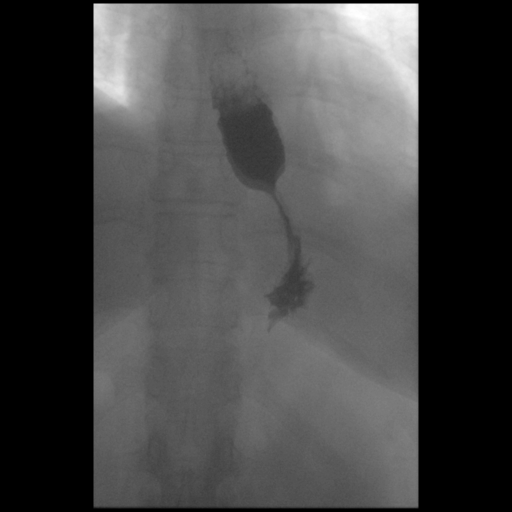

[Series 3: cp_standard · 0.42mm/px · 3 of 121 frames shown (2 of 5)]
[frame 19/121]
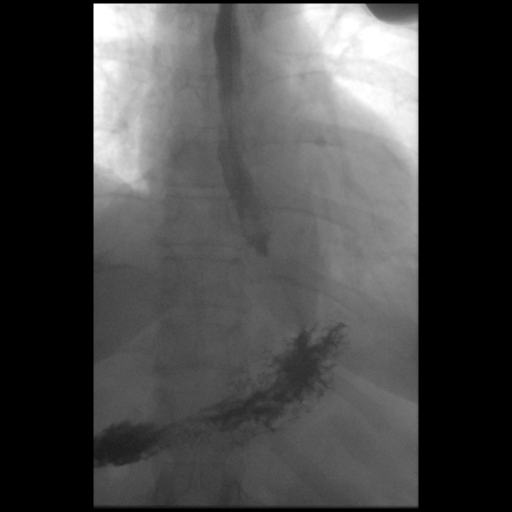
[frame 61/121]
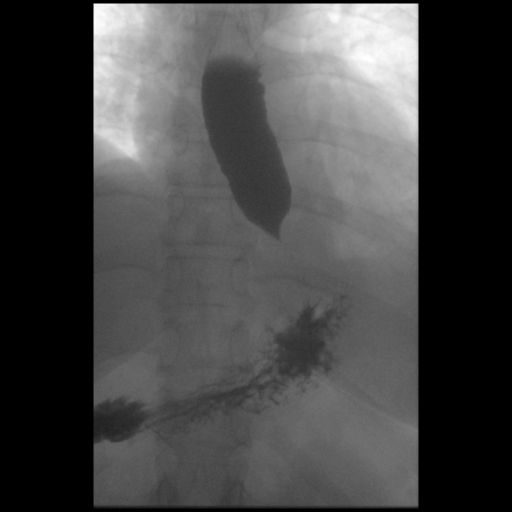
[frame 103/121]
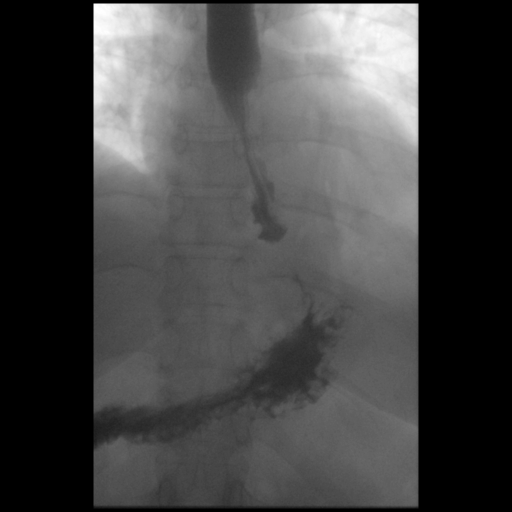

[Series 4: cp_standard · 0.21mm/px · 1 of 1 slices shown (3 of 5)]
[im 1/1]
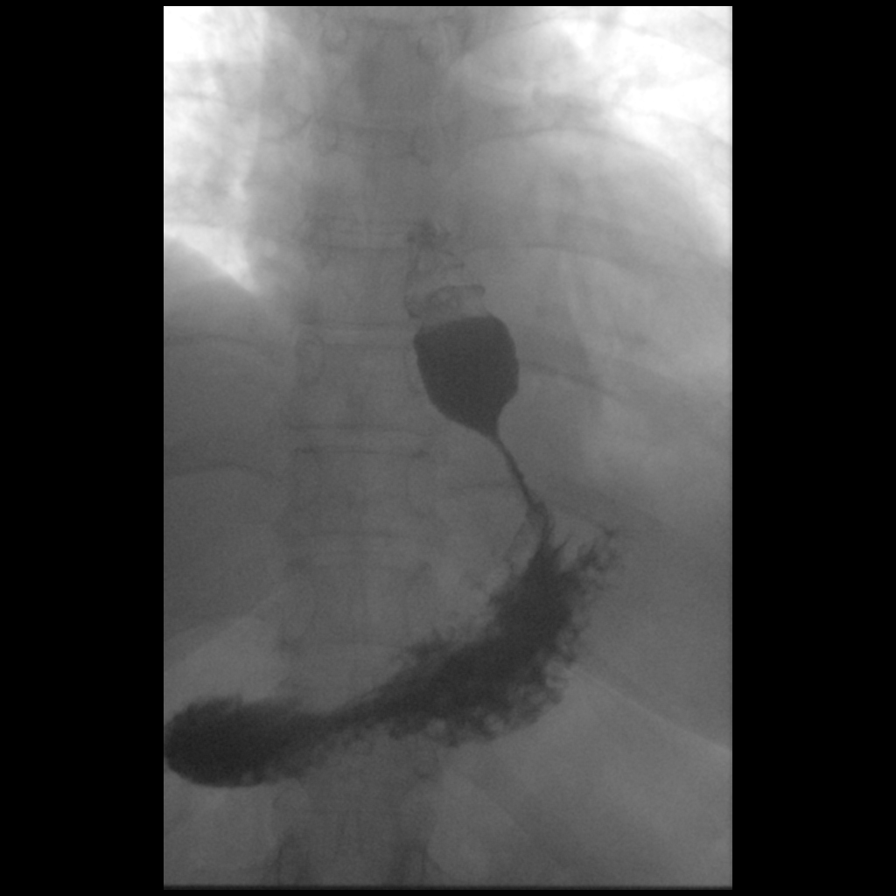

[Series 5: cp_standard · 0.42mm/px · 4 of 114 frames shown (4 of 5)]
[frame 18/114]
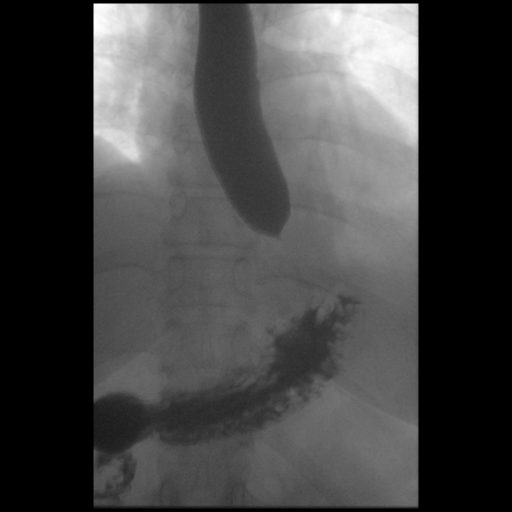
[frame 58/114]
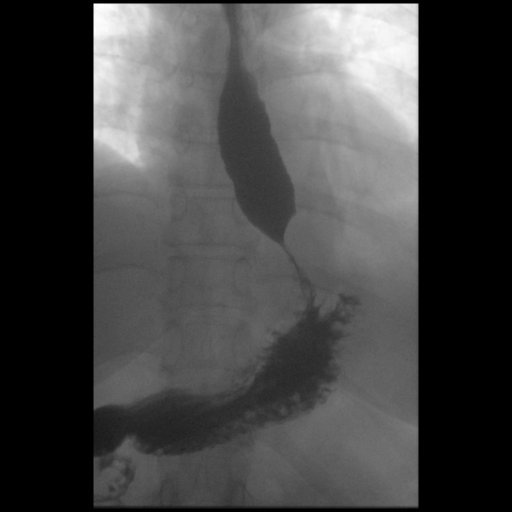
[frame 97/114]
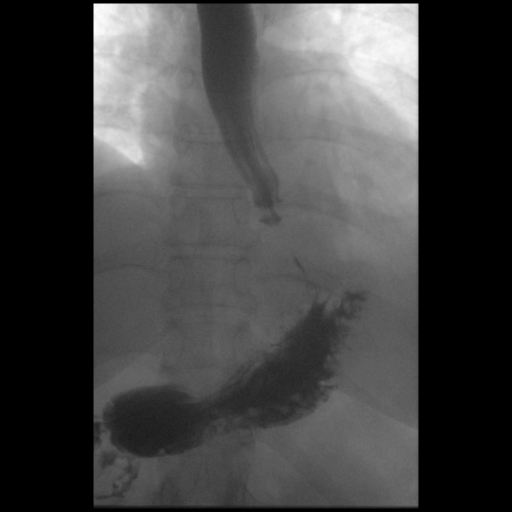
[frame 99/114]
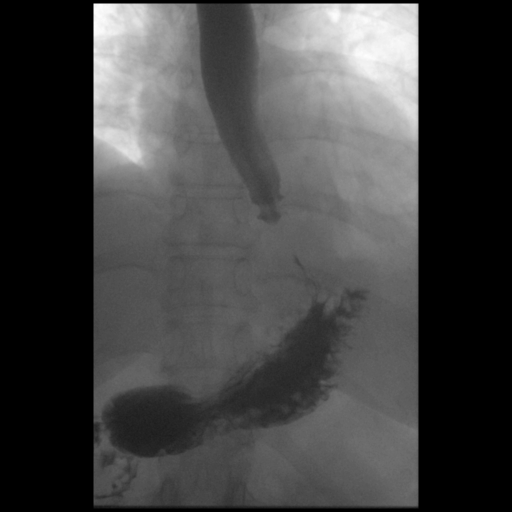

[Series 6: cp_standard · 0.21mm/px · 1 of 1 slices shown (5 of 5)]
[im 1/1]
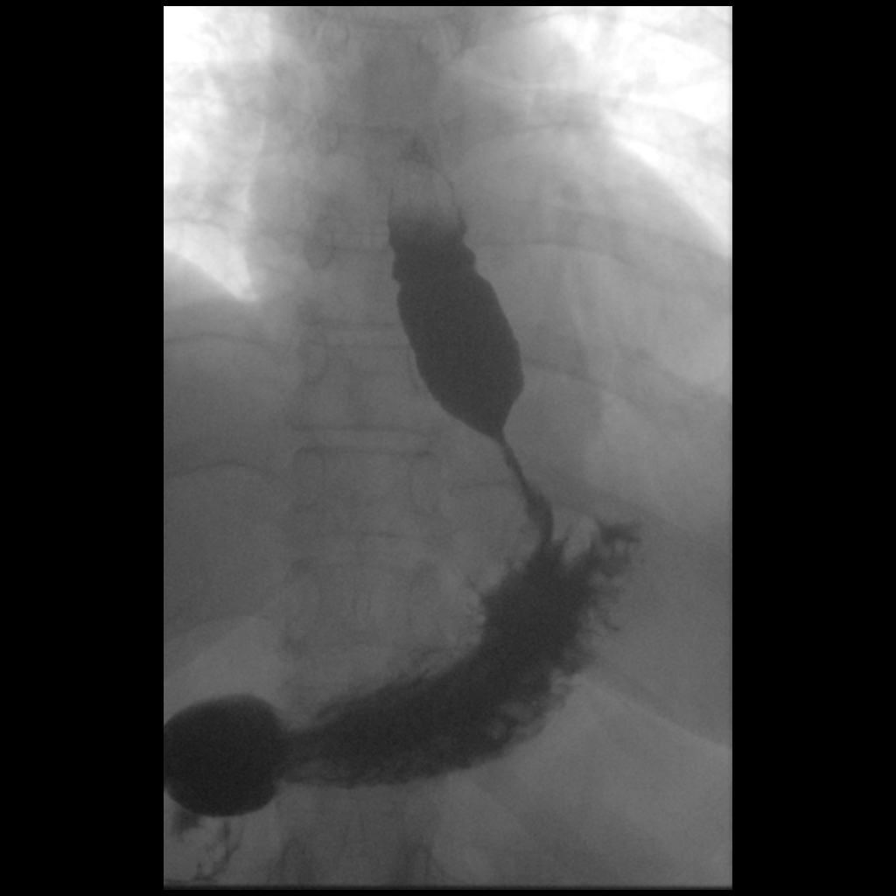

[14 of 15 positions shown; findings below may reference images not displayed]

FINDINGS: Preliminary scout AP supine abdominal image demonstrates a normal
bowel gas pattern.

The patient swallowed the water-soluble contrast without difficulty.
No evidence of contrast leak. Moderate narrowing at the site of the
fundoplication, though the esophagus does empty completely. Numerous
tertiary esophageal contractions and intermittent to and fro
esophageal motion. No evidence of residual hiatal hernia.
IMPRESSION: 1. Expected post-operative changes postop day 1 hiatal hernia
repair.
2. No evidence of contrast leak.
3. Moderate narrowing at the site of the fundoplication indicating
expected post-operative edema. The esophagus empties completely with
the patient in the 45 degree semi-erect position.
4. Numerous tertiary esophageal contractions and intermittent to and
fro esophageal motion, as expected.

Preliminary results were discussed with the patient at the time of
the examination.

## 2020-02-08 DIAGNOSIS — L309 Dermatitis, unspecified: Secondary | ICD-10-CM | POA: Diagnosis not present

## 2020-02-08 DIAGNOSIS — L281 Prurigo nodularis: Secondary | ICD-10-CM | POA: Diagnosis not present

## 2020-02-11 ENCOUNTER — Other Ambulatory Visit: Payer: Self-pay

## 2020-02-11 ENCOUNTER — Other Ambulatory Visit: Payer: Medicare Other

## 2020-02-11 DIAGNOSIS — Z20822 Contact with and (suspected) exposure to covid-19: Secondary | ICD-10-CM | POA: Diagnosis not present

## 2020-02-13 LAB — NOVEL CORONAVIRUS, NAA: SARS-CoV-2, NAA: NOT DETECTED

## 2020-02-13 LAB — SARS-COV-2, NAA 2 DAY TAT

## 2020-02-13 LAB — SPECIMEN STATUS REPORT

## 2020-03-28 ENCOUNTER — Other Ambulatory Visit: Payer: Self-pay

## 2020-03-28 ENCOUNTER — Encounter: Payer: Self-pay | Admitting: Allergy & Immunology

## 2020-03-28 ENCOUNTER — Ambulatory Visit (INDEPENDENT_AMBULATORY_CARE_PROVIDER_SITE_OTHER): Payer: Medicare Other | Admitting: Allergy & Immunology

## 2020-03-28 ENCOUNTER — Telehealth: Payer: Self-pay | Admitting: *Deleted

## 2020-03-28 VITALS — BP 146/96 | HR 80 | Temp 98.0°F | Resp 18 | Ht 64.0 in | Wt 178.6 lb

## 2020-03-28 DIAGNOSIS — R21 Rash and other nonspecific skin eruption: Secondary | ICD-10-CM

## 2020-03-28 DIAGNOSIS — J302 Other seasonal allergic rhinitis: Secondary | ICD-10-CM | POA: Diagnosis not present

## 2020-03-28 NOTE — Patient Instructions (Signed)
1. Rash - We could not do testing since you took your cetirizine so recently. - Start the prednisone pack if you have trouble over the weekend. - We will see you at 8:30 on Wednesday for skin testing. - This will answer some more questions.   2. Follow up next Wednesday for allergy testing.    Please inform us of any Emergency Department visits, hospitalizations, or changes in symptoms. Call us before going to the ED for breathing or allergy symptoms since we might be able to fit you in for a sick visit. Feel free to contact us anytime with any questions, problems, or concerns.  It was a pleasure to meet you today!  Websites that have reliable patient information: 1. American Academy of Asthma, Allergy, and Immunology: www.aaaai.org 2. Food Allergy Research and Education (FARE): foodallergy.org 3. Mothers of Asthmatics: http://www.asthmacommunitynetwork.org 4. American College of Allergy, Asthma, and Immunology: www.acaai.org   COVID-19 Vaccine Information can be found at: ShippingScam.co.uk For questions related to vaccine distribution or appointments, please email vaccine@[email or web address] or call 972-516-8257.     "Like" [place] on Facebook and Instagram for our latest updates!     HAPPY FALL!     Make sure you are registered to vote! If you have moved or changed any of your contact information, you will need to get this updated before voting!  In some cases, you MAY be able to register to vote online: [email or web address]

## 2020-03-28 NOTE — Telephone Encounter (Signed)
Medical records release form has been faxed to [facility] at fax [phone]. The form has been set up to have the patient's medical records faxed to the [facility] office

## 2020-03-28 NOTE — Progress Notes (Addendum)
NEW PATIENT  Date of Service/Encounter:  03/28/20  Referring provider: Sharilyn Sites, MD   Assessment:   Rash  Seasonal allergic rhinitis - could not do testing because she took antihistamines too recently  Plan/Recommendations:   1. Rash - We could not do testing since you took your cetirizine so recently. - Start the prednisone pack if you have trouble over the weekend. - We will see you at 8:30 on Wednesday for skin testing. - This will answer some more questions.   2. Follow up next Wednesday for allergy testing.   Subjective:   Kelly Watkins is a 64 y.o. female presenting today for evaluation of  Chief Complaint  Patient presents with  . Rash  . Pruritus    [name] has a history of the following: Patient Active Problem List   Diagnosis Date Noted  . Hx of hiatal hernia 04/19/2018  . Esophageal dysphagia 12/28/2017  . Absolute anemia 06/24/2016  . Iron deficiency 07/20/2015  . Thrombocytosis 07/10/2015  . Essential hypertension 05/29/2014    History obtained from: chart review and patient.  [name] was referred by Sharilyn Sites, MD.     [name] is a 64 y.o. female presenting for an evaluation of a rash.  She had a rash that started around 4 years ago. She was diagnosed with a "yeast rash" that was in her thighs and under her breast. She started using an acne wash and it went away. This first time that she had it, she was on three rounds of prednisone. No one seemed to know what it was.  The rash that started in September was different. She woke up and felt that something had bit her. She describes this ias a little rash". She went to a Demratologist and she did not know whta it wa.s She was given a large jar of triamcinolone. She used this for several weeks. She was also started on cetirizine 10mg  up to QID. The cetirizine helped quite a bit. But going into the last part of the third week, they found some prednisone with nearly eventual resolution  of the rash.  She thinks that this is related to ragweed which surrounds her house. Her neighbor also has a lot of ragweed. The prednisone was taken for five days and the rash improved. She has not had the rash since the third week of October.  She is followed at [facility].  Her last visit was September 2021.  At that time, she was given clobetasol as well as triamcinolone.  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  Around the same time that the rash was going on, she would have itchy eyes and runny eyes. She kept feeling that she might have been getting a cold. But she thinks that this was related to the ragweed in retrospect. She did not have similar symptoms one year ago or before at all.  She moved into the current house in 2016 and this started 1-2 years after this.    She has no history of asthma. She did not have permanent skin changes. The rash never cleared up completely until the prednisone came on board. It would not pop up and resolve. THe cetirizine helped with the itching. She was having some sleepiness from all of the cetirizine. She was using ponly 1/2 a tablet initially but she has been increasing it over the years.   She did have a history of recurrent sinus infections. Her  last sinus infection was 3-4 years ago.   Otherwise, there is no history of other atopic diseases, including asthma, food allergies, drug allergies, stinging insect allergies, urticaria or contact dermatitis. There is no significant infectious history. Vaccinations are up to date.        Past Medical History: Patient Active Problem List   Diagnosis Date Noted  . Hx of hiatal hernia 04/19/2018  . Esophageal dysphagia 12/28/2017  . Absolute anemia 06/24/2016  . Iron deficiency 07/20/2015  . Thrombocytosis 07/10/2015  . Essential hypertension 05/29/2014    Medication List:  Allergies as of 03/28/2020      Reactions   Hydrochlorothiazide  Itching   Latex Itching   Penicillins Itching   Has patient had a PCN reaction causing immediate rash, facial/tongue/throat swelling, SOB or lightheadedness with hypotension: yes Has patient had a PCN reaction causing severe rash involving mucus membranes or skin necrosis: no Has patient had a PCN reaction that required hospitalization: no Has patient had a PCN reaction occurring within the last 10 years: no If all of the above answers are "NO", then may proceed with Cephalosporin use.   Sulfa Antibiotics Itching   Estradiol Rash   "ORAL MEDICATION"      Medication List       Accurate as of March 28, 2020 11:49 AM. If you have any questions, ask your nurse or doctor.        cetirizine 10 MG tablet Commonly known as: ZYRTEC Take 10 mg by mouth at bedtime.   cholecalciferol 25 MCG (1000 UNIT) tablet Commonly known as: VITAMIN D3 Take 1,000 Units by mouth in the morning and at bedtime.   clobetasol ointment 0.05 % Commonly known as: TEMOVATE Apply 1 application topically daily.   clotrimazole 1 % cream Commonly known as: LOTRIMIN Apply to affected area 2 times daily What changed: additional instructions   cycloSPORINE 0.05 % ophthalmic emulsion Commonly known as: RESTASIS Place 1 drop into both eyes daily.   ELDERBERRY PO Take by mouth daily.   Estradiol 10 MCG Tabs vaginal tablet Place 1 tablet vaginally 3 (three) times a week.   furosemide 20 MG tablet Commonly known as: LASIX Take 20 mg by mouth as needed for fluid or edema.   Krill Oil 500 MG Caps Take 500 mg by mouth daily.   losartan 100 MG tablet Commonly known as: COZAAR Take 100 mg by mouth every morning.   prenatal multivitamin Tabs tablet Take 1 tablet by mouth daily at 12 noon.       Birth History: non-contributory  Developmental History: non-contributory  Past Surgical History: Past Surgical History:  Procedure Laterality Date  . CATARACT EXTRACTION W/ INTRAOCULAR LENS  IMPLANT,  BILATERAL  2015  . COLONOSCOPY    . COLONOSCOPY N/A 06/07/2014   Procedure: COLONOSCOPY;  Surgeon: Rogene Houston, MD;  Location: [facility];  Service: Endoscopy;  Laterality: N/A;  1250  . ESOPHAGOGASTRODUODENOSCOPY N/A 06/07/2014   Procedure: ESOPHAGOGASTRODUODENOSCOPY (EGD);  Surgeon: Rogene Houston, MD;  Location: [facility];  Service: Endoscopy;  Laterality: N/A;  . ESOPHAGOGASTRODUODENOSCOPY N/A 02/23/2018   Procedure: ESOPHAGOGASTRODUODENOSCOPY (EGD);  Surgeon: Rogene Houston, MD;  Location: [facility];  Service: Endoscopy;  Laterality: N/A;  12:20  . ESOPHAGOGASTRODUODENOSCOPY (EGD) WITH ESOPHAGEAL DILATION     X 2  . INSERTION OF MESH N/A 04/19/2018   Procedure: INSERTION OF MESH;  Surgeon: Ralene Ok, MD;  Location: [facility];  Service: General;  Laterality: N/A;  . LAPAROSCOPIC VAGINAL HYSTERECTOMY  WITH SALPINGO OOPHORECTOMY Bilateral 04-23-2002   DR TOMBIN  @[facility]    W/  ANTERIOR REPAIR AND ABLATION ENDOMETRIOSIS  . MALONEY DILATION N/A 06/07/2014   Procedure: [name] DILATION;  Surgeon: Rogene Houston, MD;  Location: [facility];  Service: Endoscopy;  Laterality: N/A;  . MANDIBLE SURGERY Bilateral 1987   CORRECT BITE  . TUBAL LIGATION Bilateral YRS AGO     Family History: Family History  Problem Relation Age of Onset  . Hypertension Mother   . Heart disease Mother   . Asthma Father   . Allergic rhinitis Sister   . Urticaria Sister      Social History: Ayleah lives at home with her husband. 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  Her husband works in a Engineer, building services.  They live in a house throughout 66 years old.  There is hardwood throughout the home.  They have electric heating in the heat pump for heating and cooling.  There is a dog inside of the home, a Restaurant manager, fast food.  She has had multiple retrievers over the years, although this is the first inside.  There are  no dust mite covers on the bedding.  There is no tobacco exposure.  She does have a HEPA filter in her home.  She is not exposed to fumes, chemicals, or dust.  She spends a lot of time outdoors doing gardening, but does not use any insecticides or other potentially noxious stimuli.   Review of Systems  Constitutional: Negative.  Negative for chills, fever, malaise/fatigue and weight loss.  HENT: Negative for congestion, ear discharge, ear pain and sinus pain.   Eyes: Negative for pain, discharge and redness.  Respiratory: Negative for cough, sputum production, shortness of breath and wheezing.   Cardiovascular: Negative.  Negative for chest pain and palpitations.  Gastrointestinal: Negative for abdominal pain, constipation, diarrhea, heartburn, nausea and vomiting.  Skin: Positive for itching and rash.  Neurological: Negative for dizziness and headaches.  Endo/Heme/Allergies: Positive for environmental allergies. Does not bruise/bleed easily.       Objective:   Blood pressure (!) 146/96, pulse 80, temperature 98 F (36.7 C), temperature source Temporal, resp. rate 18, height 5\' 4"  (1.626 m), weight 178 lb 9.6 oz (81 kg), SpO2 97 %. Body mass index is 30.66 kg/m.   Physical Exam:   Physical Exam Constitutional:      Appearance: She is well-developed.  HENT:     Head: Normocephalic and atraumatic.     Right Ear: Tympanic membrane, ear canal and external ear normal. No drainage, swelling or tenderness. Tympanic membrane is not injected, scarred, erythematous, retracted or bulging.     Left Ear: Tympanic membrane, ear canal and external ear normal. No drainage, swelling or tenderness. Tympanic membrane is not injected, scarred, erythematous, retracted or bulging.     Nose: No nasal deformity, septal deviation, mucosal edema or rhinorrhea.     Right Turbinates: Enlarged and swollen.     Left Turbinates: Enlarged and swollen.     Right Sinus: No maxillary sinus tenderness or frontal  sinus tenderness.     Left Sinus: No maxillary sinus tenderness or frontal sinus tenderness.     Comments: She does have some clear rhinorrhea.     Mouth/Throat:     Mouth: Mucous membranes are not pale and not dry.     Pharynx: Uvula midline.  Eyes:     General:  Right eye: No discharge.        Left eye: No discharge.     Conjunctiva/sclera: Conjunctivae normal.     Right eye: Right conjunctiva is not injected. No chemosis.    Left eye: Left conjunctiva is not injected. No chemosis.    Pupils: Pupils are equal, round, and reactive to light.  Cardiovascular:     Rate and Rhythm: Normal rate and regular rhythm.     Heart sounds: Normal heart sounds.  Pulmonary:     Effort: Pulmonary effort is normal. No tachypnea, accessory muscle usage or respiratory distress.     Breath sounds: Normal breath sounds. No wheezing, rhonchi or rales.     Comments: Moving air well in all lung fields. No increased work of breathing noted.  Chest:     Chest wall: No tenderness.  Abdominal:     Tenderness: There is no abdominal tenderness. There is no guarding or rebound.  Lymphadenopathy:     Head:     Right side of head: No submandibular, tonsillar or occipital adenopathy.     Left side of head: No submandibular, tonsillar or occipital adenopathy.     Cervical: No cervical adenopathy.  Skin:    Coloration: Skin is not pale.     Findings: No abrasion, erythema, petechiae or rash. Rash is not papular, urticarial or vesicular.     Comments: No apparent skin manifestations appreciated. She does have some age spots present.   Neurological:     Mental Status: She is alert.  Psychiatric:        Behavior: Behavior is cooperative.      Diagnostic studies: deferred due to recent antihistamine use      Salvatore Marvel, MD All[facility] and [place] [facility] [employer] [facility]

## 2020-04-02 ENCOUNTER — Other Ambulatory Visit: Payer: Self-pay

## 2020-04-02 ENCOUNTER — Encounter: Payer: Self-pay | Admitting: Allergy & Immunology

## 2020-04-02 ENCOUNTER — Ambulatory Visit (INDEPENDENT_AMBULATORY_CARE_PROVIDER_SITE_OTHER): Payer: Medicare Other | Admitting: Allergy & Immunology

## 2020-04-02 VITALS — BP 124/68 | HR 87 | Resp 18

## 2020-04-02 DIAGNOSIS — J3089 Other allergic rhinitis: Secondary | ICD-10-CM | POA: Diagnosis not present

## 2020-04-02 DIAGNOSIS — R21 Rash and other nonspecific skin eruption: Secondary | ICD-10-CM | POA: Diagnosis not present

## 2020-04-02 DIAGNOSIS — J302 Other seasonal allergic rhinitis: Secondary | ICD-10-CM | POA: Diagnosis not present

## 2020-04-02 NOTE — Telephone Encounter (Signed)
[facility] to find out about records and the status of them. Advise to contact our office to see if we can get those records.

## 2020-04-02 NOTE — Progress Notes (Signed)
FOLLOW UP  Date of Service/Encounter:  04/02/20   Assessment:   Rash   Seasonal allergic rhinitis - could not do testing because she took antihistamines too recently  Plan/Recommendations:   1. Seasonal and perennial allergic rhinitis - Testing today showed: grasses, weeds, trees, indoor molds, outdoor molds, dust mites, cat and dog - Copy of test results provided.  - Avoidance measures provided. - Continue with: Zyrtec 10mg , but you can increase to twice daily on particularly bad days - Allergy shots are a consideration, but try the extra Zyrtec first.  2. Return in about 6 months (around 09/30/2020).    Subjective:   Kelly Watkins is a 64 y.o. female presenting today for follow up of  Chief Complaint  Patient presents with  . Allergy Testing    Kelly Watkins has a history of the following: Patient Active Problem List   Diagnosis Date Noted  . Hx of hiatal hernia 04/19/2018  . Esophageal dysphagia 12/28/2017  . Absolute anemia 06/24/2016  . Iron deficiency 07/20/2015  . Thrombocytosis 07/10/2015  . Essential hypertension 05/29/2014    History obtained from: chart review and patient.  Kelly Watkins is a 63 y.o. female presenting for skin testing. She was last seen last week. Unfortunately, she had take her cetirizine and we could not do testing. We stopped her cetirizine and gave her some prednisone to use over the weekend.   In the interim, she has done well.   Otherwise, there have been no changes to her past medical history, surgical history, family history, or social history.    Review of Systems  Constitutional: Negative.  Negative for chills, fever, malaise/fatigue and weight loss.  HENT: Negative for congestion, ear discharge, ear pain and sinus pain.   Eyes: Negative for pain, discharge and redness.  Respiratory: Negative for cough, sputum production, shortness of breath and wheezing.   Cardiovascular: Negative.  Negative for chest pain and palpitations.    Gastrointestinal: Negative for abdominal pain, constipation, diarrhea, heartburn, nausea and vomiting.  Skin: Positive for itching and rash.  Neurological: Negative for dizziness and headaches.  Endo/Heme/Allergies: Positive for environmental allergies. Does not bruise/bleed easily.       Objective:   Blood pressure 124/68, pulse 87, resp. rate 18, SpO2 97 %. There is no height or weight on file to calculate BMI.   Physical Exam: deferred since this was skin testing appointment only   Diagnostic studies:     Allergy Studies:     Airborne Adult Perc - 04/02/20 0851    Time Antigen Placed 0938    Allergen Manufacturer Lavella Hammock    Location Back    Number of Test 59    Panel 1 Select    1. Control-Buffer 50% Glycerol Negative    2. Control-Histamine 1 mg/ml 2+    3. Albumin saline Negative    4. Big Horn Negative    5. [place] Negative    6. Johnson Negative    7. Niles Blue Negative    8. Meadow Fescue Negative    9. Perennial Rye Negative    10. Sweet Vernal Negative    11. Timothy Negative    12. Cocklebur Negative    13. Burweed Marshelder Negative    14. Ragweed, short Negative    15. Ragweed, Giant Negative    16. Plantain,  English Negative    17. Lamb's Quarters Negative    18. Sheep Sorrell Negative    19. Rough Pigweed Negative    20. Skeet Simmer  Elder, Rough Negative    21. Mugwort, Common Negative    22. Ash mix Negative    23. Birch mix Negative    24. Beech American Negative    25. Box, Elder Negative    26. Cedar, red Negative    27. Cottonwood, [place] Negative    28. Elm mix Negative    29. Hickory Negative    30. Maple mix Negative    31. Oak, [place] mix Negative    32. Pecan Pollen Negative    33. Pine mix Negative    34. Sycamore Eastern Negative    35. Marlinton, Black Pollen Negative    36. Alternaria alternata Negative    37. Cladosporium Herbarum Negative    38. Aspergillus mix Negative    39. Penicillium mix Negative    40. Bipolaris  sorokiniana (Helminthosporium) Negative    41. Drechslera spicifera (Curvularia) Negative    42. Mucor plumbeus Negative    43. Fusarium moniliforme Negative    44. Aureobasidium pullulans (pullulara) Negative    45. Rhizopus oryzae Negative    46. Botrytis cinera Negative    47. Epicoccum nigrum Negative    48. Phoma betae Negative    49. Candida Albicans Negative    50. Trichophyton mentagrophytes Negative    51. Mite, D Farinae  5,000 AU/ml Negative    52. Mite, D Pteronyssinus  5,000 AU/ml Negative    53. Cat Hair 10,000 BAU/ml Negative    54.  Dog Epithelia Negative    55. Mixed Feathers Negative    56. Horse Epithelia Negative    57. Cockroach, German Negative    58. Mouse Negative    59. Tobacco Leaf Negative          Intradermal - 04/02/20 0915    Time Antigen Placed 0915    Allergen Manufacturer Lavella Hammock    Location Back    Number of Test 15    Intradermal Select    Control Negative    Guatemala 1+    Johnson 1+    7 Grass Negative    Ragweed mix Negative    Weed mix 1+    Tree mix 2+    Mold 1 1+    Mold 2 2+    Mold 3 2+    Mold 4 2+    Cat 1+    Dog 1+    Cockroach Negative    Mite mix 2+           Allergy testing results were read and interpreted by myself, documented by clinical staff.      Salvatore Marvel, MD  Allergy and [place] [facility] [place]

## 2020-04-02 NOTE — Patient Instructions (Addendum)
1. Seasonal and perennial allergic rhinitis - Testing today showed: grasses, weeds, trees, indoor molds, outdoor molds, dust mites, cat and dog - Copy of test results provided.  - Avoidance measures provided. - Continue with: Zyrtec 10mg , but you can increase to twice daily on particularly bad days - Allergy shots are a consideration, but try the extra Zyrtec first.  2. Return in about 6 months (around 09/30/2020).    Please inform us of any Emergency Department visits, hospitalizations, or changes in symptoms. Call us before going to the ED for breathing or allergy symptoms since we might be able to fit you in for a sick visit. Feel free to contact us anytime with any questions, problems, or concerns.  It was a pleasure to see you again today!  Websites that have reliable patient information: 1. American Academy of Asthma, Allergy, and Immunology: www.aaaai.org 2. Food Allergy Research and Education (FARE): foodallergy.org 3. Mothers of Asthmatics: http://www.asthmacommunitynetwork.org 4. American College of Allergy, Asthma, and Immunology: www.acaai.org   COVID-19 Vaccine Information can be found at: [email or web address].[email or web address].uk For questions related to vaccine distribution or appointments, please email [email or web address] or call [phone].     "Like" [place] on Facebook and Instagram for our latest updates!     HAPPY FALL!     Make sure you are registered to vote! If you have moved or changed any of your contact information, you will need to get this updated before voting!  In some cases, you MAY be able to register to vote online: [email or web address]     Reducing Pollen Exposure  The American Academy of Allergy, Asthma and Immunology suggests the following steps to reduce your exposure to pollen during allergy seasons.    1. Do not hang sheets or clothing out to dry; pollen may collect on  these items. 2. Do not mow lawns or spend time around freshly cut grass; mowing stirs up pollen. 3. Keep windows closed at night.  Keep car windows closed while driving. 4. Minimize morning activities outdoors, a time when pollen counts are usually at their highest. 5. Stay indoors as much as possible when pollen counts or humidity is high and on windy days when pollen tends to remain in the air longer. 6. Use air conditioning when possible.  Many air conditioners have filters that trap the pollen spores. 7. Use a HEPA room air filter to remove pollen form the indoor air you breathe.  Control of Mold Allergen   Mold and fungi can grow on a variety of surfaces provided certain temperature and moisture conditions exist.  Outdoor molds grow on plants, decaying vegetation and soil.  The major outdoor mold, Alternaria and Cladosporium, are found in very high numbers during hot and dry conditions.  Generally, a late Summer - Fall peak is seen for common outdoor fungal spores.  Rain will temporarily lower outdoor mold spore count, but counts rise rapidly when the rainy period ends.  The most important indoor molds are Aspergillus and Penicillium.  Dark, humid and poorly ventilated basements are ideal sites for mold growth.  The next most common sites of mold growth are the bathroom and the kitchen.  Outdoor (Seasonal) Mold Control  Positive outdoor molds via skin testing: Alternaria, Cladosporium, Bipolaris (Helminthsporium), Drechslera (Curvalaria) and Mucor  1. Use air conditioning and keep windows closed 2. Avoid exposure to decaying vegetation. 3. Avoid leaf raking. 4. Avoid grain handling. 5. Consider wearing a face mask if working in moldy areas.  6.   Indoor (Perennial) Mold Control  Positive indoor molds via skin testing: Aspergillus, Penicillium, Fusarium, Aureobasidium (Pullulara) and Rhizopus  1. Maintain humidity below 50%. 2. Clean washable surfaces with 5% bleach  solution. 3. Remove sources e.g. contaminated carpets.     Control of Dog or Cat Allergen  Avoidance is the best way to manage a dog or cat allergy. If you have a dog or cat and are allergic to dog or cats, consider removing the dog or cat from the home. If you have a dog or cat but don't want to find it a new home, or if your family wants a pet even though someone in the household is allergic, here are some strategies that may help keep symptoms at bay:  1. Keep the pet out of your bedroom and restrict it to only a few rooms. Be advised that keeping the dog or cat in only one room will not limit the allergens to that room. 2. Don't pet, hug or kiss the dog or cat; if you do, wash your hands with soap and water. 3. High-efficiency particulate air (HEPA) cleaners run continuously in a bedroom or living room can reduce allergen levels over time. 4. Regular use of a high-efficiency vacuum cleaner or a central vacuum can reduce allergen levels. 5. Giving your dog or cat a bath at least once a week can reduce airborne allergen.  Control of Dust Mite Allergen    Dust mites play a major role in allergic asthma and rhinitis.  They occur in environments with high humidity wherever human skin is found.  Dust mites absorb humidity from the atmosphere (ie, they do not drink) and feed on organic matter (including shed human and animal skin).  Dust mites are a microscopic type of insect that you cannot see with the naked eye.  High levels of dust mites have been detected from mattresses, pillows, carpets, upholstered furniture, bed covers, clothes, soft toys and any woven material.  The principal allergen of the dust mite is found in its feces.  A gram of dust may contain 1,000 mites and 250,000 fecal particles.  Mite antigen is easily measured in the air during house cleaning activities.  Dust mites do not bite and do not cause harm to humans, other than by triggering allergies/asthma.    Ways to decrease  your exposure to dust mites in your home:  1. Encase mattresses, box springs and pillows with a mite-impermeable barrier or cover   2. Wash sheets, blankets and drapes weekly in hot water (130 F) with detergent and dry them in a dryer on the hot setting.  3. Have the room cleaned frequently with a vacuum cleaner and a damp dust-mop.  For carpeting or rugs, vacuuming with a vacuum cleaner equipped with a high-efficiency particulate air (HEPA) filter.  The dust mite allergic individual should not be in a room which is being cleaned and should wait 1 hour after cleaning before going into the room. 4. Do not sleep on upholstered furniture (eg, couches).   5. If possible removing carpeting, upholstered furniture and drapery from the home is ideal.  Horizontal blinds should be eliminated in the rooms where the person spends the most time (bedroom, study, television room).  Washable vinyl, roller-type shades are optimal. 6. Remove all non-washable stuffed toys from the bedroom.  Wash stuffed toys weekly like sheets and blankets above.   7. Reduce indoor humidity to less than 50%.  Inexpensive humidity monitors can be purchased at most hardware stores.  Do not use a  humidifier as can make the problem worse and are not recommended.  Allergy Shots   Allergies are the result of a chain reaction that starts in the immune system. Your immune system controls how your body defends itself. For instance, if you have an allergy to pollen, your immune system identifies pollen as an invader or allergen. Your immune system overreacts by producing antibodies called Immunoglobulin E (IgE). These antibodies travel to cells that release chemicals, causing an allergic reaction.  The concept behind allergy immunotherapy, whether it is received in the form of shots or tablets, is that the immune system can be desensitized to specific allergens that trigger allergy symptoms. Although it requires time and patience, the payback can  be long-term relief.  How Do Allergy Shots Work?  Allergy shots work much like a vaccine. Your body responds to injected amounts of a particular allergen given in increasing doses, eventually developing a resistance and tolerance to it. Allergy shots can lead to decreased, minimal or no allergy symptoms.  There generally are two phases: build-up and maintenance. Build-up often ranges from three to six months and involves receiving injections with increasing amounts of the allergens. The shots are typically given once or twice a week, though more rapid build-up schedules are sometimes used.  The maintenance phase begins when the most effective dose is reached. This dose is different for each person, depending on how allergic you are and your response to the build-up injections. Once the maintenance dose is reached, there are longer periods between injections, typically two to four weeks.  Occasionally doctors give cortisone-type shots that can temporarily reduce allergy symptoms. These types of shots are different and should not be confused with allergy immunotherapy shots.  Who Can Be Treated with Allergy Shots?  Allergy shots may be a good treatment approach for people with allergic rhinitis (hay fever), allergic asthma, conjunctivitis (eye allergy) or stinging insect allergy.   Before deciding to begin allergy shots, you should consider:  . The length of allergy season and the severity of your symptoms . Whether medications and/or changes to your environment can control your symptoms . Your desire to avoid long-term medication use . Time: allergy immunotherapy requires a major time commitment . Cost: may vary depending on your insurance coverage  Allergy shots for children age 61 and older are effective and often well tolerated. They might prevent the onset of new allergen sensitivities or the progression to asthma.  Allergy shots are not started on patients who are pregnant but can be  continued on patients who become pregnant while receiving them. In some patients with other medical conditions or who take certain common medications, allergy shots may be of risk. It is important to mention other medications you talk to your allergist.   When Will I Feel Better?  Some may experience decreased allergy symptoms during the build-up phase. For others, it may take as long as 12 months on the maintenance dose. If there is no improvement after a year of maintenance, your allergist will discuss other treatment options with you.  If you aren't responding to allergy shots, it may be because there is not enough dose of the allergen in your vaccine or there are missing allergens that were not identified during your allergy testing. Other reasons could be that there are high levels of the allergen in your environment or major exposure to non-allergic triggers like tobacco smoke.  What Is the Length of Treatment?  Once the maintenance dose is reached, allergy shots  are generally continued for three to five years. The decision to stop should be discussed with your allergist at that time. Some people may experience a permanent reduction of allergy symptoms. Others may relapse and a longer course of allergy shots can be considered.  What Are the Possible Reactions?  The two types of adverse reactions that can occur with allergy shots are local and systemic. Common local reactions include very mild redness and swelling at the injection site, which can happen immediately or several hours after. A systemic reaction, which is less common, affects the entire body or a particular body system. They are usually mild and typically respond quickly to medications. Signs include increased allergy symptoms such as sneezing, a stuffy nose or hives.  Rarely, a serious systemic reaction called anaphylaxis can develop. Symptoms include swelling in the throat, wheezing, a feeling of tightness in the chest, nausea or  dizziness. Most serious systemic reactions develop within 30 minutes of allergy shots. This is why it is strongly recommended you wait in your doctor's office for 30 minutes after your injections. Your allergist is trained to watch for reactions, and his or her staff is trained and equipped with the proper medications to identify and treat them.  Who Should Administer Allergy Shots?  The preferred location for receiving shots is your prescribing allergist's office. Injections can sometimes be given at another facility where the physician and staff are trained to recognize and treat reactions, and have received instructions by your prescribing allergist.

## 2020-04-09 NOTE — Telephone Encounter (Signed)
Records were received 04/04/2020 and given to the provider.

## 2020-04-15 NOTE — Progress Notes (Signed)
Reviewed biopsy results.  She had a biopsy on 04/01/20.  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  Results scanned into Epic.  Salvatore Marvel, MD [facility]

## 2020-05-01 ENCOUNTER — Other Ambulatory Visit: Payer: Self-pay | Admitting: Allergy & Immunology

## 2020-05-08 DIAGNOSIS — E539 Vitamin B deficiency, unspecified: Secondary | ICD-10-CM | POA: Diagnosis not present

## 2020-05-08 DIAGNOSIS — R946 Abnormal results of thyroid function studies: Secondary | ICD-10-CM | POA: Diagnosis not present

## 2020-05-08 DIAGNOSIS — E7849 Other hyperlipidemia: Secondary | ICD-10-CM | POA: Diagnosis not present

## 2020-05-08 DIAGNOSIS — E559 Vitamin D deficiency, unspecified: Secondary | ICD-10-CM | POA: Diagnosis not present

## 2020-05-08 DIAGNOSIS — D473 Essential (hemorrhagic) thrombocythemia: Secondary | ICD-10-CM | POA: Diagnosis not present

## 2020-05-08 DIAGNOSIS — E538 Deficiency of other specified B group vitamins: Secondary | ICD-10-CM | POA: Diagnosis not present

## 2020-05-08 DIAGNOSIS — I10 Essential (primary) hypertension: Secondary | ICD-10-CM | POA: Diagnosis not present

## 2020-05-08 DIAGNOSIS — R7309 Other abnormal glucose: Secondary | ICD-10-CM | POA: Diagnosis not present

## 2020-10-01 ENCOUNTER — Ambulatory Visit: Payer: Medicare Other | Admitting: Allergy & Immunology

## 2020-10-10 DIAGNOSIS — H16223 Keratoconjunctivitis sicca, not specified as Sjogren's, bilateral: Secondary | ICD-10-CM | POA: Diagnosis not present

## 2020-10-10 DIAGNOSIS — H40013 Open angle with borderline findings, low risk, bilateral: Secondary | ICD-10-CM | POA: Diagnosis not present

## 2020-10-10 DIAGNOSIS — H26493 Other secondary cataract, bilateral: Secondary | ICD-10-CM | POA: Diagnosis not present

## 2020-12-04 DIAGNOSIS — Z1231 Encounter for screening mammogram for malignant neoplasm of breast: Secondary | ICD-10-CM | POA: Diagnosis not present

## 2020-12-04 DIAGNOSIS — M816 Localized osteoporosis [Lequesne]: Secondary | ICD-10-CM | POA: Diagnosis not present

## 2020-12-10 DIAGNOSIS — H16223 Keratoconjunctivitis sicca, not specified as Sjogren's, bilateral: Secondary | ICD-10-CM | POA: Diagnosis not present

## 2020-12-10 DIAGNOSIS — H524 Presbyopia: Secondary | ICD-10-CM | POA: Diagnosis not present

## 2021-02-09 DIAGNOSIS — U071 COVID-19: Secondary | ICD-10-CM | POA: Diagnosis not present

## 2021-05-25 DIAGNOSIS — H1013 Acute atopic conjunctivitis, bilateral: Secondary | ICD-10-CM | POA: Diagnosis not present

## 2021-12-18 DIAGNOSIS — Z1231 Encounter for screening mammogram for malignant neoplasm of breast: Secondary | ICD-10-CM | POA: Diagnosis not present

## 2022-01-11 DIAGNOSIS — H524 Presbyopia: Secondary | ICD-10-CM | POA: Diagnosis not present

## 2022-08-18 DIAGNOSIS — S92335A Nondisplaced fracture of third metatarsal bone, left foot, initial encounter for closed fracture: Secondary | ICD-10-CM | POA: Diagnosis not present

## 2022-09-06 DIAGNOSIS — S92335D Nondisplaced fracture of third metatarsal bone, left foot, subsequent encounter for fracture with routine healing: Secondary | ICD-10-CM | POA: Diagnosis not present

## 2022-11-08 DIAGNOSIS — S92335D Nondisplaced fracture of third metatarsal bone, left foot, subsequent encounter for fracture with routine healing: Secondary | ICD-10-CM | POA: Diagnosis not present

## 2024-05-01 ENCOUNTER — Encounter (INDEPENDENT_AMBULATORY_CARE_PROVIDER_SITE_OTHER): Payer: Self-pay | Admitting: *Deleted

## 2024-05-03 ENCOUNTER — Encounter (INDEPENDENT_AMBULATORY_CARE_PROVIDER_SITE_OTHER): Payer: Self-pay | Admitting: *Deleted

## 2024-05-16 ENCOUNTER — Encounter (HOSPITAL_COMMUNITY): Payer: Self-pay | Admitting: Oncology

## 2024-05-23 ENCOUNTER — Encounter (INDEPENDENT_AMBULATORY_CARE_PROVIDER_SITE_OTHER): Payer: Self-pay | Admitting: Gastroenterology

## 2024-05-23 ENCOUNTER — Ambulatory Visit (INDEPENDENT_AMBULATORY_CARE_PROVIDER_SITE_OTHER): Admitting: Gastroenterology

## 2024-05-23 VITALS — BP 137/81 | HR 71 | Temp 97.3°F | Ht 64.0 in | Wt 157.9 lb

## 2024-05-23 DIAGNOSIS — R1319 Other dysphagia: Secondary | ICD-10-CM

## 2024-05-23 DIAGNOSIS — R1310 Dysphagia, unspecified: Secondary | ICD-10-CM | POA: Diagnosis not present

## 2024-05-23 DIAGNOSIS — Z91014 Allergy to mammalian meats: Secondary | ICD-10-CM | POA: Diagnosis not present

## 2024-05-23 DIAGNOSIS — Z1211 Encounter for screening for malignant neoplasm of colon: Secondary | ICD-10-CM | POA: Insufficient documentation

## 2024-05-23 NOTE — H&P (View-Only) (Signed)
 Theotis Gerdeman Faizan Terianna Peggs , M.D. Gastroenterology & Hepatology [facility] [address] [place], [place] [address] Primary Care Physician: Toribio Jerel MATSU, MD [address] [place] [address]  Chief Complaint: Dysphagia and screening colonoscopy  History of Present Illness: Kelly Watkins is a 69 y.o. female hypertension, alpha gal syndrome GERD status post Nissen fundoplication who presents for evaluation of Dysphagia and screening colonoscopy  Patient was last seen by Dr. Golda in 2019 .  Patient had Nissen Fundoplication and since then has been off antireflux medications  Patient reports occasional solid food dysphagia without any regurgitation or chest pain.  She was diagnosed with alpha gal and has been avoiding mammal meat and since then her diarrhea and abdominal pain has significantly improved The patient denies having any nausea, vomiting, fever, chills, hematochezia, melena, hematemesis, abdominal distention, abdominal pain, diarrhea, jaundice, pruritus or weight loss.  Last EGD:2019 - Normal esophagus. Tortuous distal segment due to presence of hernia. - Z- line regular, 30 cm from the incisors. - 6 cm hiatal hernia ( moderate size) . - Multiple gastric polyps. - A few non- bleeding telngiectasia in prepyloric region of the stomach. - Normal duodenal bulb and second portion of the duodenum. - No specimens collected.   2016  Nonobstructive Schatzki's ring dilated by passing 56 French Maloney dilator but ring had to be disrupted with focal biopsy. Small sliding hiatal hernia. Multiple gastric polyps ranging in size from 4-15 mm. Three of these polyps were hot snared and retrieved for histologic examination.   Last Colonoscopy:2016   Prep excellent. Normal mucosa of cecum, ascending colon, hepatic flexure, transverse colon, splenic flexure, descending and sigmoid colon. Normal mucosa of rectum. Hemorrhoids noted below the dentate line.    fundic type polyps without dysplasia    Labs from 12/27/2023 negative celiac positive alpha gal  Past Medical History: Past Medical History:  Diagnosis Date   Dysphagia    Essential hypertension    GERD (gastroesophageal reflux disease)    Hiatal hernia    IDA (iron  deficiency anemia)    hx IV iron  (followed by [facility])---  PER PT LAST INFUSION SPRING 2019   Pre-diabetes    Schatzki's ring    s/p  EGD w/ dilation   Urticaria     Past Surgical History: Past Surgical History:  Procedure Laterality Date   CATARACT EXTRACTION W/ INTRAOCULAR LENS  IMPLANT, BILATERAL  2015   COLONOSCOPY     COLONOSCOPY N/A 06/07/2014   Procedure: COLONOSCOPY;  Surgeon: Claudis RAYMOND Golda, MD;  Location: [facility];  Service: Endoscopy;  Laterality: N/A;  1250   ESOPHAGOGASTRODUODENOSCOPY N/A 06/07/2014   Procedure: ESOPHAGOGASTRODUODENOSCOPY (EGD);  Surgeon: Claudis RAYMOND Golda, MD;  Location: [facility];  Service: Endoscopy;  Laterality: N/A;   ESOPHAGOGASTRODUODENOSCOPY N/A 02/23/2018   Procedure: ESOPHAGOGASTRODUODENOSCOPY (EGD);  Surgeon: Golda Claudis RAYMOND, MD;  Location: [facility];  Service: Endoscopy;  Laterality: N/A;  12:20   ESOPHAGOGASTRODUODENOSCOPY (EGD) WITH ESOPHAGEAL DILATION     X 2   INSERTION OF MESH N/A 04/19/2018   Procedure: INSERTION OF MESH;  Surgeon: Rubin Calamity, MD;  Location: [facility];  Service: General;  Laterality: N/A;   LAPAROSCOPIC VAGINAL HYSTERECTOMY WITH SALPINGO OOPHORECTOMY Bilateral 04-23-2002   DR TOMBIN  @[facility]    W/  ANTERIOR REPAIR AND ABLATION ENDOMETRIOSIS   MALONEY DILATION N/A 06/07/2014   Procedure: AGAPITO DILATION;  Surgeon: Claudis RAYMOND Golda, MD;  Location: [facility];  Service: Endoscopy;  Laterality: N/A;   MANDIBLE SURGERY Bilateral 1987   CORRECT BITE   TUBAL LIGATION Bilateral YRS AGO    Family History: Family History  Problem Relation Age of Onset   Hypertension Mother    Heart disease Mother    Asthma Father    Allergic  rhinitis Sister    Urticaria Sister     Social History:Tobacco Use History[1] Social History   Substance and Sexual Activity  Alcohol Use No   Alcohol/week: 0.0 standard drinks of alcohol   Social History   Substance and Sexual Activity  Drug Use Never    Allergies: Allergies[2]  Medications: Current Outpatient Medications  Medication Sig Dispense Refill   Polyethylene Glycol 400 (BLINK TEARS OP) Apply to eye.     cetirizine (ZYRTEC) 10 MG tablet Take 10 mg by mouth at bedtime.  (Patient not taking: Reported on 05/23/2024)     cholecalciferol (VITAMIN D3) 25 MCG (1000 UNIT) tablet Take 1,000 Units by mouth in the morning and at bedtime.  (Patient not taking: Reported on 05/23/2024)     clobetasol ointment (TEMOVATE) 0.05 % Apply 1 application topically daily.  (Patient not taking: Reported on 05/23/2024)  0   clotrimazole  (LOTRIMIN ) 1 % cream Apply to affected area 2 times daily (Patient not taking: Reported on 05/23/2024) 15 g 0   cycloSPORINE (RESTASIS) 0.05 % ophthalmic emulsion Place 1 drop into both eyes daily. (Patient not taking: Reported on 05/23/2024)     ELDERBERRY PO Take by mouth daily. (Patient not taking: Reported on 05/23/2024)     Estradiol 10 MCG TABS vaginal tablet Place 1 tablet vaginally 3 (three) times a week.  (Patient not taking: Reported on 05/23/2024)     furosemide  (LASIX ) 20 MG tablet Take 20 mg by mouth as needed for fluid or edema.  (Patient not taking: Reported on 05/23/2024)     Krill Oil 500 MG CAPS Take 500 mg by mouth daily.  (Patient not taking: Reported on 05/23/2024)     losartan  (COZAAR ) 100 MG tablet Take 100 mg by mouth every morning.  (Patient not taking: Reported on 05/23/2024)  0   Prenatal Vit-Fe Fumarate-FA (PRENATAL MULTIVITAMIN) TABS tablet Take 1 tablet by mouth daily at 12 noon. (Patient not taking: Reported on 05/23/2024)     triamcinolone (KENALOG) 0.1 % Apply topically. (Patient not taking: Reported on 05/23/2024)     No current facility-administered  medications for this visit.    Review of Systems: GENERAL: negative for malaise, night sweats HEENT: No changes in hearing or vision, no nose bleeds or other nasal problems. NECK: Negative for lumps, goiter, pain and significant neck swelling RESPIRATORY: Negative for cough, wheezing CARDIOVASCULAR: Negative for chest pain, leg swelling, palpitations, orthopnea GI: SEE HPI MUSCULOSKELETAL: Negative for joint pain or swelling, back pain, and muscle pain. SKIN: Negative for lesions, rash HEMATOLOGY Negative for prolonged bleeding, bruising easily, and swollen nodes. ENDOCRINE: Negative for cold or heat intolerance, polyuria, polydipsia and goiter. NEURO: negative for tremor, gait imbalance, syncope and seizures. The remainder of the review of systems is noncontributory.   Physical Exam: BP 137/81   Pulse 71   Temp (!) 97.3 F (36.3 C)   Ht 5' 4 (1.626 m)   Wt 157 lb 14.4 oz (71.6 kg)   BMI 27.10 kg/m  GENERAL: The patient is AO x3, in no acute distress. HEENT: Head is normocephalic and atraumatic. EOMI are intact. Mouth is well hydrated and without lesions. NECK: Supple. No masses LUNGS: Clear to auscultation. No presence  of rhonchi/wheezing/rales. Adequate chest expansion HEART: RRR, normal s1 and s2. ABDOMEN: Soft, nontender, no guarding, no peritoneal signs, and nondistended. BS +. No masses.  Imaging/Labs: as above     Latest Ref Rng & Units 04/07/2018    2:02 PM 06/25/2015    3:30 PM 11/10/2011   11:13 AM  CBC  WBC 4.0 - 10.5 K/uL 8.6  8.7  7.5   Hemoglobin 12.0 - 15.0 g/dL 87.0  86.7  86.6   Hematocrit 36.0 - 46.0 % 41.3  39.7  40.2   Platelets 150 - 400 K/uL 345  389  347    Lab Results  Component Value Date   IRON  41 06/25/2015   TIBC 441 06/25/2015   FERRITIN 20 06/25/2015    I personally reviewed and interpreted the available labs, imaging and endoscopic files.  Impression and Plan:  Emmalynn SHANTEE HAYNE is a 69 y.o. female hypertension, alpha gal syndrome  GERD status post Nissen fundoplication who presents for evaluation of Dysphagia and screening colonoscopy  #Dysphagia   Patient has occasional solid food dysphagia this could be post Nissen fundoplication or recurrence of Schatzki's ring which was dilated back in 2016  I recommend upper endoscopy with dilation  Patient has been off PPI since fundoplication back in 2019  #Screening colonoscopy   The patient was counseled regarding the importance of colorectal cancer screening. The benefits of screening include early detection of colorectal cancer and precancerous polyps, which can improve treatment outcomes and reduce mortality. Risks associated with screening, particularly colonoscopy, include potential complications such as bleeding and perforation. After deciding different modalities for screening for colon cancer , patient has opted to pursue Colonoscopy   #Alpha Gal   Avoid all mammal meat protein  All questions were answered.      Ellias Mcelreath Faizan Kyasia Steuck, MD Gastroenterology and Hepatology [facility]   This chart has been completed using [facility] Dictation software, and while attempts have been made to ensure accuracy , certain words and phrases may not be transcribed as intended      [1]  Social History Tobacco Use  Smoking Status Never  Smokeless Tobacco Never  [2]  Allergies Allergen Reactions   Hydrochlorothiazide Itching   Latex Itching   Penicillins Itching    Has patient had a PCN reaction causing immediate rash, facial/tongue/throat swelling, SOB or lightheadedness with hypotension: yes Has patient had a PCN reaction causing severe rash involving mucus membranes or skin necrosis: no Has patient had a PCN reaction that required hospitalization: no Has patient had a PCN reaction occurring within the last 10 years: no If all of the above answers are NO, then may proceed with Cephalosporin use.    Sulfa Antibiotics Itching    Estradiol Rash    ORAL MEDICATION

## 2024-05-23 NOTE — Patient Instructions (Signed)
 It was very nice to meet you today, as dicussed with will plan for the following :  1) Upper endoscopy with dilation  2) Colonoscopy

## 2024-05-23 NOTE — Progress Notes (Signed)
 Theotis Gerdeman Faizan Terianna Peggs , M.D. Gastroenterology & Hepatology [facility] [address] [place], [place] [address] Primary Care Physician: Toribio Jerel MATSU, MD [address] [place] [address]  Chief Complaint: Dysphagia and screening colonoscopy  History of Present Illness: Kelly Watkins is a 69 y.o. female hypertension, alpha gal syndrome GERD status post Nissen fundoplication who presents for evaluation of Dysphagia and screening colonoscopy  Patient was last seen by Dr. Golda in 2019 .  Patient had Nissen Fundoplication and since then has been off antireflux medications  Patient reports occasional solid food dysphagia without any regurgitation or chest pain.  She was diagnosed with alpha gal and has been avoiding mammal meat and since then her diarrhea and abdominal pain has significantly improved The patient denies having any nausea, vomiting, fever, chills, hematochezia, melena, hematemesis, abdominal distention, abdominal pain, diarrhea, jaundice, pruritus or weight loss.  Last EGD:2019 - Normal esophagus. Tortuous distal segment due to presence of hernia. - Z- line regular, 30 cm from the incisors. - 6 cm hiatal hernia ( moderate size) . - Multiple gastric polyps. - A few non- bleeding telngiectasia in prepyloric region of the stomach. - Normal duodenal bulb and second portion of the duodenum. - No specimens collected.   2016  Nonobstructive Schatzki's ring dilated by passing 56 French Maloney dilator but ring had to be disrupted with focal biopsy. Small sliding hiatal hernia. Multiple gastric polyps ranging in size from 4-15 mm. Three of these polyps were hot snared and retrieved for histologic examination.   Last Colonoscopy:2016   Prep excellent. Normal mucosa of cecum, ascending colon, hepatic flexure, transverse colon, splenic flexure, descending and sigmoid colon. Normal mucosa of rectum. Hemorrhoids noted below the dentate line.    fundic type polyps without dysplasia    Labs from 12/27/2023 negative celiac positive alpha gal  Past Medical History: Past Medical History:  Diagnosis Date   Dysphagia    Essential hypertension    GERD (gastroesophageal reflux disease)    Hiatal hernia    IDA (iron  deficiency anemia)    hx IV iron  (followed by [facility])---  PER PT LAST INFUSION SPRING 2019   Pre-diabetes    Schatzki's ring    s/p  EGD w/ dilation   Urticaria     Past Surgical History: Past Surgical History:  Procedure Laterality Date   CATARACT EXTRACTION W/ INTRAOCULAR LENS  IMPLANT, BILATERAL  2015   COLONOSCOPY     COLONOSCOPY N/A 06/07/2014   Procedure: COLONOSCOPY;  Surgeon: Claudis RAYMOND Golda, MD;  [facility]: [facility];  Service: Endoscopy;  Laterality: N/A;  1250   ESOPHAGOGASTRODUODENOSCOPY N/A 06/07/2014   Procedure: ESOPHAGOGASTRODUODENOSCOPY (EGD);  Surgeon: Claudis RAYMOND Golda, MD;  Location: [facility];  Service: Endoscopy;  Laterality: N/A;   ESOPHAGOGASTRODUODENOSCOPY N/A 02/23/2018   Procedure: ESOPHAGOGASTRODUODENOSCOPY (EGD);  Surgeon: Golda Claudis RAYMOND, MD;  Location: [facility];  Service: Endoscopy;  Laterality: N/A;  12:20   ESOPHAGOGASTRODUODENOSCOPY (EGD) WITH ESOPHAGEAL DILATION     X 2   INSERTION OF MESH N/A 04/19/2018   Procedure: INSERTION OF MESH;  Surgeon: Rubin Calamity, MD;  Location: [facility];  Service: General;  Laterality: N/A;   LAPAROSCOPIC VAGINAL HYSTERECTOMY WITH SALPINGO OOPHORECTOMY Bilateral 04-23-2002   DR TOMBIN  @[facility]    W/  ANTERIOR REPAIR AND ABLATION ENDOMETRIOSIS   MALONEY DILATION N/A 06/07/2014   Procedure: AGAPITO DILATION;  Surgeon: Claudis RAYMOND Golda, MD;  [facility]: [facility];  Service: Endoscopy;  Laterality: N/A;   MANDIBLE SURGERY Bilateral 1987   CORRECT BITE   TUBAL LIGATION Bilateral YRS AGO    Family History: Family History  Problem Relation Age of Onset   Hypertension Mother    Heart disease Mother    Asthma Father    Allergic  rhinitis Sister    Urticaria Sister     Social History:Tobacco Use History[1] Social History   Substance and Sexual Activity  Alcohol Use No   Alcohol/week: 0.0 standard drinks of alcohol   Social History   Substance and Sexual Activity  Drug Use Never    Allergies: Allergies[2]  Medications: Current Outpatient Medications  Medication Sig Dispense Refill   Polyethylene Glycol 400 (BLINK TEARS OP) Apply to eye.     cetirizine (ZYRTEC) 10 MG tablet Take 10 mg by mouth at bedtime.  (Patient not taking: Reported on 05/23/2024)     cholecalciferol (VITAMIN D3) 25 MCG (1000 UNIT) tablet Take 1,000 Units by mouth in the morning and at bedtime.  (Patient not taking: Reported on 05/23/2024)     clobetasol ointment (TEMOVATE) 0.05 % Apply 1 application topically daily.  (Patient not taking: Reported on 05/23/2024)  0   clotrimazole  (LOTRIMIN ) 1 % cream Apply to affected area 2 times daily (Patient not taking: Reported on 05/23/2024) 15 g 0   cycloSPORINE (RESTASIS) 0.05 % ophthalmic emulsion Place 1 drop into both eyes daily. (Patient not taking: Reported on 05/23/2024)     ELDERBERRY PO Take by mouth daily. (Patient not taking: Reported on 05/23/2024)     Estradiol 10 MCG TABS vaginal tablet Place 1 tablet vaginally 3 (three) times a week.  (Patient not taking: Reported on 05/23/2024)     furosemide  (LASIX ) 20 MG tablet Take 20 mg by mouth as needed for fluid or edema.  (Patient not taking: Reported on 05/23/2024)     Krill Oil 500 MG CAPS Take 500 mg by mouth daily.  (Patient not taking: Reported on 05/23/2024)     losartan  (COZAAR ) 100 MG tablet Take 100 mg by mouth every morning.  (Patient not taking: Reported on 05/23/2024)  0   Prenatal Vit-Fe Fumarate-FA (PRENATAL MULTIVITAMIN) TABS tablet Take 1 tablet by mouth daily at 12 noon. (Patient not taking: Reported on 05/23/2024)     triamcinolone (KENALOG) 0.1 % Apply topically. (Patient not taking: Reported on 05/23/2024)     No current facility-administered  medications for this visit.    Review of Systems: GENERAL: negative for malaise, night sweats HEENT: No changes in hearing or vision, no nose bleeds or other nasal problems. NECK: Negative for lumps, goiter, pain and significant neck swelling RESPIRATORY: Negative for cough, wheezing CARDIOVASCULAR: Negative for chest pain, leg swelling, palpitations, orthopnea GI: SEE HPI MUSCULOSKELETAL: Negative for joint pain or swelling, back pain, and muscle pain. SKIN: Negative for lesions, rash HEMATOLOGY Negative for prolonged bleeding, bruising easily, and swollen nodes. ENDOCRINE: Negative for cold or heat intolerance, polyuria, polydipsia and goiter. NEURO: negative for tremor, gait imbalance, syncope and seizures. The remainder of the review of systems is noncontributory.   Physical Exam: BP 137/81   Pulse 71   Temp (!) 97.3 F (36.3 C)   Ht 5' 4 (1.626 m)   Wt 157 lb 14.4 oz (71.6 kg)   BMI 27.10 kg/m  GENERAL: The patient is AO x3, in no acute distress. HEENT: Head is normocephalic and atraumatic. EOMI are intact. Mouth is well hydrated and without lesions. NECK: Supple. No masses LUNGS: Clear to auscultation. No presence  of rhonchi/wheezing/rales. Adequate chest expansion HEART: RRR, normal s1 and s2. ABDOMEN: Soft, nontender, no guarding, no peritoneal signs, and nondistended. BS +. No masses.  Imaging/Labs: as above     Latest Ref Rng & Units 04/07/2018    2:02 PM 06/25/2015    3:30 PM 11/10/2011   11:13 AM  CBC  WBC 4.0 - 10.5 K/uL 8.6  8.7  7.5   Hemoglobin 12.0 - 15.0 g/dL 87.0  86.7  86.6   Hematocrit 36.0 - 46.0 % 41.3  39.7  40.2   Platelets 150 - 400 K/uL 345  389  347    Lab Results  Component Value Date   IRON  41 06/25/2015   TIBC 441 06/25/2015   FERRITIN 20 06/25/2015    I personally reviewed and interpreted the available labs, imaging and endoscopic files.  Impression and Plan:  Kelly Watkins is a 69 y.o. female hypertension, alpha gal syndrome  GERD status post Nissen fundoplication who presents for evaluation of Dysphagia and screening colonoscopy  #Dysphagia   Patient has occasional solid food dysphagia this could be post Nissen fundoplication or recurrence of Schatzki's ring which was dilated back in 2016  I recommend upper endoscopy with dilation  Patient has been off PPI since fundoplication back in 2019  #Screening colonoscopy   The patient was counseled regarding the importance of colorectal cancer screening. The benefits of screening include early detection of colorectal cancer and precancerous polyps, which can improve treatment outcomes and reduce mortality. Risks associated with screening, particularly colonoscopy, include potential complications such as bleeding and perforation. After deciding different modalities for screening for colon cancer , patient has opted to pursue Colonoscopy   #Alpha Gal   Avoid all mammal meat protein  All questions were answered.      Kelly Mcelreath Faizan Kyasia Steuck, MD Gastroenterology and Hepatology [facility]enter[facility]   This chart has been completed using [facility] Dictation software, and while attempts have been made to ensure accuracy , certain words and phrases may not be transcribed as intended      [1]  Social History Tobacco Use  Smoking Status Never  Smokeless Tobacco Never  [2]  Allergies Allergen Reactions   Hydrochlorothiazide Itching   Latex Itching   Penicillins Itching    Has patient had a PCN reaction causing immediate rash, facial/tongue/throat swelling, SOB or lightheadedness with hypotension: yes Has patient had a PCN reaction causing severe rash involving mucus membranes or skin necrosis: no Has patient had a PCN reaction that required hospitalization: no Has patient had a PCN reaction occurring within the last 10 years: no If all of the above answers are NO, then may proceed with Cephalosporin use.    Sulfa Antibiotics Itching    Estradiol Rash    ORAL MEDICATION

## 2024-05-31 ENCOUNTER — Telehealth (INDEPENDENT_AMBULATORY_CARE_PROVIDER_SITE_OTHER): Payer: Self-pay

## 2024-05-31 MED ORDER — PEG 3350-KCL-NA BICARB-NACL 420 G PO SOLR: 4000.0000 mL | Freq: Once | ORAL | 0 refills | Status: AC

## 2024-05-31 NOTE — Telephone Encounter (Signed)
 ATC pt to schedule EGD/TCS/DIL, no answer. LVM for call back.

## 2024-05-31 NOTE — Addendum Note (Signed)
 Addended by: DALLIE LIONEL RAMAN on: 05/31/2024 10:34 AM   Modules accepted: Orders

## 2024-05-31 NOTE — Telephone Encounter (Signed)
 PA on [facility] for TCS/EGD/DIL: Notification or Prior Authorization is not required for the requested services You are not required to submit a notification/prior authorization based on the information provided.  Decision ID #: [id number]

## 2024-05-31 NOTE — Telephone Encounter (Signed)
 Spoke with patient, scheduled TCS/EGD/DIL for 06/21/2024 at 8:30 am. Rx sent to pharmacy. Instructions sent to [facility] and mailed.

## 2024-06-15 NOTE — Telephone Encounter (Signed)
 Patient is scheduled for 06/21/2024 Thursday for her colonoscopy. She stated that one of her friends told her about the sutab and she is wondering if she can take those instead of the trilyte. Please advise if you would like her to change her prep or keep it as the trilyte.

## 2024-06-15 NOTE — Telephone Encounter (Signed)
 She may have Sutabs , just advice her sometimes its not covered by insurance

## 2024-06-18 ENCOUNTER — Encounter (HOSPITAL_COMMUNITY)
Admission: RE | Admit: 2024-06-18 | Discharge: 2024-06-18 | Disposition: A | Discharge status: Home / Self Care | Source: Ambulatory Visit | Attending: Gastroenterology | Admitting: Gastroenterology

## 2024-06-18 ENCOUNTER — Other Ambulatory Visit: Payer: Self-pay

## 2024-06-18 ENCOUNTER — Encounter (HOSPITAL_COMMUNITY): Payer: Self-pay

## 2024-06-21 ENCOUNTER — Ambulatory Visit (HOSPITAL_COMMUNITY): Admitting: Anesthesiology

## 2024-06-21 ENCOUNTER — Encounter (HOSPITAL_COMMUNITY): Payer: Self-pay | Admitting: Gastroenterology

## 2024-06-21 ENCOUNTER — Encounter (HOSPITAL_COMMUNITY): Admission: RE | Disposition: A | Payer: Self-pay | Source: Home / Self Care | Attending: Gastroenterology

## 2024-06-21 ENCOUNTER — Encounter (INDEPENDENT_AMBULATORY_CARE_PROVIDER_SITE_OTHER): Payer: Self-pay | Admitting: *Deleted

## 2024-06-21 ENCOUNTER — Ambulatory Visit (HOSPITAL_COMMUNITY)
Admission: RE | Admit: 2024-06-21 | Discharge: 2024-06-21 | Disposition: A | Source: Home / Self Care | Attending: Gastroenterology | Admitting: Gastroenterology

## 2024-06-21 DIAGNOSIS — K2970 Gastritis, unspecified, without bleeding: Secondary | ICD-10-CM

## 2024-06-21 DIAGNOSIS — K222 Esophageal obstruction: Secondary | ICD-10-CM

## 2024-06-21 DIAGNOSIS — K2289 Other specified disease of esophagus: Secondary | ICD-10-CM

## 2024-06-21 DIAGNOSIS — K5989 Other specified functional intestinal disorders: Secondary | ICD-10-CM

## 2024-06-21 DIAGNOSIS — K3189 Other diseases of stomach and duodenum: Secondary | ICD-10-CM

## 2024-06-21 DIAGNOSIS — Z1211 Encounter for screening for malignant neoplasm of colon: Secondary | ICD-10-CM

## 2024-06-21 DIAGNOSIS — Q394 Esophageal web: Secondary | ICD-10-CM

## 2024-06-21 DIAGNOSIS — K640 First degree hemorrhoids: Secondary | ICD-10-CM

## 2024-06-21 DIAGNOSIS — K648 Other hemorrhoids: Secondary | ICD-10-CM

## 2024-06-21 HISTORY — DX: Allergy to mammalian meats: Z91.014

## 2024-06-21 LAB — HM COLONOSCOPY

## 2024-06-21 MED ORDER — LACTATED RINGERS IV SOLN: INTRAVENOUS | Status: DC | PRN

## 2024-06-21 MED ORDER — LIDOCAINE 2% (20 MG/ML) 5 ML SYRINGE
INTRAMUSCULAR | Status: DC | PRN
  Administered 2024-06-21: 100 mg via INTRAVENOUS

## 2024-06-21 MED ORDER — PROPOFOL 10 MG/ML IV BOLUS
INTRAVENOUS | Status: DC | PRN
  Administered 2024-06-21: 70 mg via INTRAVENOUS
  Administered 2024-06-21 (×2): 30 mg via INTRAVENOUS
  Administered 2024-06-21: 125 ug/kg/min via INTRAVENOUS

## 2024-06-21 NOTE — Discharge Instructions (Signed)

## 2024-06-21 NOTE — Op Note (Signed)
 [facility] Patient Name: Kelly Watkins Procedure Date: 06/21/2024 7:45 AM MRN: [id number] Date of Birth: 24-Aug-1955 Attending MD: Deatrice Dine , MD, [id number] CSN: [id number] Age: 69 Admit Type: Outpatient Procedure:                Upper GI endoscopy Indications:              Dysphagia, Assessment following Nissen                            fundoplication Providers:                Deatrice Dine, MD, Jon LABOR. Gerome RN, RN,                            Dorcas Lenis, Technician Referring MD:              Medicines:                Monitored Anesthesia Care Complications:            No immediate complications. Estimated Blood Loss:     Estimated blood loss was minimal. Procedure:                Pre-Anesthesia Assessment:                           - Prior to the procedure, a History and Physical                            was performed, and patient medications and                            allergies were reviewed. The patient's tolerance of                            previous anesthesia was also reviewed. The risks                            and benefits of the procedure and the sedation                            options and risks were discussed with the patient.                            All questions were answered, and informed consent                            was obtained. Prior Anticoagulants: The patient has                            taken no anticoagulant or antiplatelet agents. ASA                            Grade Assessment: II - A patient with mild systemic  disease. After reviewing the risks and benefits,                            the patient was deemed in satisfactory condition to                            undergo the procedure.                           After obtaining informed consent, the endoscope was                            passed under direct vision. Throughout the                            procedure, the patient's blood  pressure, pulse, and                            oxygen  saturations were monitored continuously. The                            [id number] ([id number]) Upper was introduced through                            the mouth, and advanced to the second part of                            duodenum. The upper GI endoscopy was accomplished                            without difficulty. The patient tolerated the                            procedure well. Scope In: 7:52:39 AM Scope Out: 8:05:47 AM Total Procedure Duration: 0 hours 13 minutes 8 seconds  Findings:      One benign-appearing, intrinsic moderate stenosis was found at the       gastroesophageal junction. The stenosis was traversed. A TTS dilator was       passed through the scope. Dilation with a 15-16.5-18 mm balloon dilator       was performed to 18 mm. The dilation site was examined following       endoscope reinsertion and showed moderate mucosal disruption.      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 A TTS dilator was       passed through the scope. Dilation with a 15-16.5-18 mm balloon dilator       was performed to 18 mm. The dilation site was examined following       endoscope reinsertion and showed mild mucosal disruption.      Mucosal changes characterized by nodularity were found at the       gastroesophageal junction. Biopsies were taken with a cold forceps for       histology.      Evidence of a Nissen fundoplication was found at the gastroesophageal       junction. The wrap appeared intact. This was traversed.  Mild inflammation characterized by erythema was found in the gastric       body. Biopsies were taken with a cold forceps for histology.      The duodenal bulb and second portion of the duodenum were normal. Impression:               - Benign-appearing esophageal stenosis. Dilated.                           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                           - Nodular mucosa in  the esophagus. Biopsied.                           - A Nissen fundoplication was found. The wrap                            appears intact.                           - Gastritis. Biopsied.                           - Normal duodenal bulb and second portion of the                            duodenum. Moderate Sedation:      Per Anesthesia Care Recommendation:           - Patient has a contact number available for                            emergencies. The signs and symptoms of potential                            delayed complications were discussed with the                            patient. Return to normal activities tomorrow.                            Written discharge instructions were provided to the                            patient.                           - Resume previous diet.                           - Continue present medications.                           - Await pathology results.                           - Repeat upper endoscopy PRN.                           -  Return to GI clinic as previously scheduled. Procedure Code(s):        --- Professional ---                           [phone], Esophagogastroduodenoscopy, flexible,                            transoral; with transendoscopic balloon dilation of                            esophagus (less than 30 mm diameter)                           [id number], 59, Esophagogastroduodenoscopy, flexible,                            transoral; with biopsy, single or multiple Diagnosis Code(s):        --- Professional ---                           K22.2, Esophageal obstruction                           Q39.4, Esophageal web                           K22.89, Other specified disease of esophagus                           Z98.890, Other specified postprocedural states                           K29.70, Gastritis, unspecified, without bleeding                           R13.10, Dysphagia, unspecified                           Z09, Encounter  for follow-up examination after                            completed treatment for conditions other than                            malignant neoplasm CPT copyright 2022 American Medical Association. All rights reserved. The codes documented in this report are preliminary and upon coder review may  be revised to meet current compliance requirements. Deatrice Dine, MD Deatrice Dine, MD 06/21/2024 8:41:39 AM This report has been signed electronically. Number of Addenda: 0

## 2024-06-21 NOTE — Transfer of Care (Addendum)
 Immediate Anesthesia Transfer of Care Note  Patient: Kelly Watkins  Procedure(s) Performed: COLONOSCOPY EGD (ESOPHAGOGASTRODUODENOSCOPY) DILATION, ESOPHAGUS  Patient Location: Short Stay  Anesthesia Type:MAC  Level of Consciousness: drowsy and patient cooperative  Airway & Oxygen  Therapy: Patient Spontanous Breathing and Patient connected to face mask oxygen   Post-op Assessment: Report given to RN and Post -op Vital signs reviewed and stable  Post vital signs: Reviewed and stable  Last Vitals:  Vitals Value Taken Time  BP 97/47 06/21/24   0837  Temp 36.2 06/21/24   0837  Pulse 65 06/21/24   0837  Resp 16 06/21/24   0837  SpO2 100% 06/21/24   0837    Last Pain:  Vitals:   06/21/24 0720  TempSrc: Oral  PainSc: 0-No pain      Patients Stated Pain Goal: 7 (06/21/24 0720)  Complications: No notable events documented.

## 2024-06-21 NOTE — Anesthesia Preprocedure Evaluation (Signed)
"                                    Anesthesia Evaluation  Patient identified by MRN, date of birth, ID band Patient awake    Reviewed: Allergy & Precautions, H&P , NPO status , Patient's Chart, lab work & pertinent test results, reviewed documented beta blocker date and time   Airway Mallampati: II  TM Distance: >3 FB Neck ROM: full    Dental no notable dental hx.    Pulmonary neg pulmonary ROS   Pulmonary exam normal breath sounds clear to auscultation       Cardiovascular Exercise Tolerance: Good hypertension,  Rhythm:regular Rate:Normal     Neuro/Psych  Neuromuscular disease  negative psych ROS   GI/Hepatic Neg liver ROS, hiatal hernia,GERD  ,,  Endo/Other  negative endocrine ROS    Renal/GU negative Renal ROS  negative genitourinary   Musculoskeletal   Abdominal   Peds  Hematology  (+) Blood dyscrasia, anemia   Anesthesia Other Findings   Reproductive/Obstetrics negative OB ROS                              Anesthesia Physical Anesthesia Plan  ASA: 2  Anesthesia Plan: MAC   Post-op Pain Management:    Induction:   PONV Risk Score and Plan: Propofol  infusion  Airway Management Planned:   Additional Equipment:   Intra-op Plan:   Post-operative Plan:   Informed Consent: I have reviewed the patients History and Physical, chart, labs and discussed the procedure including the risks, benefits and alternatives for the proposed anesthesia with the patient or authorized representative who has indicated his/her understanding and acceptance.     Dental Advisory Given  Plan Discussed with: CRNA  Anesthesia Plan Comments:         Anesthesia Quick Evaluation  "

## 2024-06-21 NOTE — Anesthesia Procedure Notes (Addendum)
 Date/Time: 06/21/2024 10:48 AM  Performed by: Para Barrows L, CRNAComments: POM Face Mask.

## 2024-06-21 NOTE — Interval H&P Note (Signed)
 History and Physical Interval Note:  06/21/2024 7:39 AM  Kelly Watkins  has presented today for surgery, with the diagnosis of screening colonoscopy, dysphagia.  The various methods of treatment have been discussed with the patient and family. After consideration of risks, benefits and other options for treatment, the patient has consented to  Procedures with comments: COLONOSCOPY (N/A) - 8:30am, ASA any room EGD (ESOPHAGOGASTRODUODENOSCOPY) (N/A) DILATION, ESOPHAGUS (N/A) as a surgical intervention.  The patient's history has been reviewed, patient examined, no change in status, stable for surgery.  I have reviewed the patient's chart and labs.  Questions were answered to the patient's satisfaction.     Deatrice FALCON Kashus Karlen

## 2024-06-21 NOTE — Op Note (Signed)
 [facility] Patient Name: Kelly Watkins Procedure Date: 06/21/2024 7:44 AM MRN: [id number] Date of Birth: 03/31/1956 Attending MD: Deatrice Dine , MD, [id number] CSN: [id number] Age: 69 Admit Type: Outpatient Procedure:                Colonoscopy Indications:              Screening for colorectal malignant neoplasm Providers:                Deatrice Dine, MD, Jon LABOR. Gerome RN, RN,                            Dorcas Lenis, Technician Referring MD:              Medicines:                Monitored Anesthesia Care Complications:            No immediate complications. Estimated Blood Loss:     Estimated blood loss was minimal. Procedure:                Pre-Anesthesia Assessment:                           - Prior to the procedure, a History and Physical                            was performed, and patient medications and                            allergies were reviewed. The patient's tolerance of                            previous anesthesia was also reviewed. The risks                            and benefits of the procedure and the sedation                            options and risks were discussed with the patient.                            All questions were answered, and informed consent                            was obtained. Prior Anticoagulants: The patient has                            taken no anticoagulant or antiplatelet agents. ASA                            Grade Assessment: II - A patient with mild systemic                            disease. After reviewing the risks and benefits,  the patient was deemed in satisfactory condition to                            undergo the procedure.                           After obtaining informed consent, the colonoscope                            was passed under direct vision. Throughout the                            procedure, the patient's blood pressure, pulse, and                             oxygen  saturations were monitored continuously. The                            PCF-HQ190L ([id number]) Peds Colon was introduced                            through the anus and advanced to the the terminal                            ileum. The colonoscopy was performed without                            difficulty. The patient tolerated the procedure                            well. The quality of the bowel preparation was                            evaluated using the BBPS [facility] Bowel Preparation                            Scale) with scores of: Right Colon = 3, Transverse                            Colon = 3 and Left Colon = 3 (entire mucosa seen                            well with no residual staining, small fragments of                            stool or opaque liquid). The total BBPS score                            equals 9. The terminal ileum, ileocecal valve,                            appendiceal orifice, and rectum were photographed. Scope In: 8:11:17 AM Scope Out: 8:31:31 AM Scope Withdrawal Time: 0 hours  17 minutes 50 seconds  Total Procedure Duration: 0 hours 20 minutes 14 seconds  Findings:      A localized area of moderately erythematous mucosa was found in the       recto-sigmoid colon. Biopsies were taken with a cold forceps for       histology.      There is no endoscopic evidence of polyps in the entire colon.      Non-bleeding internal hemorrhoids were found during retroflexion. The       hemorrhoids were small.      The terminal ileum appeared normal. Impression:               - Erythematous mucosa in the recto-sigmoid colon.                            Biopsied.                           - Non-bleeding internal hemorrhoids.                           - The examined portion of the ileum was normal. Moderate Sedation:      Per Anesthesia Care Recommendation:           - Patient has a contact number available for                            emergencies. The signs and  symptoms of potential                            delayed complications were discussed with the                            patient. Return to normal activities tomorrow.                            Written discharge instructions were provided to the                            patient.                           - Resume previous diet.                           - Continue present medications.                           - Await pathology results.                           - Repeat colonoscopy in 10 years for screening                            purposes.                           - Return to GI office as previously scheduled. Procedure Code(s):        ---  Professional ---                           [id number], Colonoscopy, flexible; with biopsy, single                            or multiple Diagnosis Code(s):        --- Professional ---                           Z12.11, Encounter for screening for malignant                            neoplasm of colon                           K63.89, Other specified diseases of intestine                           K64.8, Other hemorrhoids CPT copyright 2022 [employer]. All rights reserved. The codes documented in this report are preliminary and upon coder review may  be revised to meet current compliance requirements. Deatrice Dine, MD Deatrice Dine, MD 06/21/2024 8:45:06 AM This report has been signed electronically. Number of Addenda: 0

## 2024-06-21 NOTE — Anesthesia Postprocedure Evaluation (Signed)
"   Anesthesia Post Note  Patient: Kelly Watkins  Procedure(s) Performed: COLONOSCOPY EGD (ESOPHAGOGASTRODUODENOSCOPY) DILATION, ESOPHAGUS  Patient location during evaluation: Phase II Anesthesia Type: MAC Level of consciousness: awake Pain management: pain level controlled Vital Signs Assessment: post-procedure vital signs reviewed and stable Respiratory status: spontaneous breathing and respiratory function stable Cardiovascular status: blood pressure returned to baseline and stable Postop Assessment: no headache and no apparent nausea or vomiting Anesthetic complications: no Comments: Late entry   No notable events documented.   Last Vitals:  Vitals:   06/21/24 0720 06/21/24 0837  BP: (!) 161/88 (!) 97/47  Pulse: 81 65  Resp: 16 16  Temp: 36.7 C (!) 36.2 C  SpO2: 99% 100%    Last Pain:  Vitals:   06/21/24 0837  TempSrc: Axillary  PainSc: 0-No pain                 Yvonna PARAS Onesha Krebbs      "

## 2024-06-22 LAB — SURGICAL PATHOLOGY
